# Patient Record
Sex: Female | Born: 1985 | Race: Black or African American | Hispanic: No | Marital: Married | Smoking: Never smoker
Health system: Southern US, Community
[De-identification: ages and names within clinical notes are randomized; demographics above are authoritative.]

## PROBLEM LIST (undated history)

## (undated) DIAGNOSIS — D219 Benign neoplasm of connective and other soft tissue, unspecified: Secondary | ICD-10-CM

## (undated) DIAGNOSIS — R87629 Unspecified abnormal cytological findings in specimens from vagina: Secondary | ICD-10-CM

## (undated) HISTORY — DX: Unspecified abnormal cytological findings in specimens from vagina: R87.629

## (undated) HISTORY — DX: Benign neoplasm of connective and other soft tissue, unspecified: D21.9

## (undated) HISTORY — PX: GYNECOLOGIC CRYOSURGERY: SHX857

---

## 2006-02-27 ENCOUNTER — Encounter: Payer: Self-pay | Admitting: Family Medicine

## 2008-09-02 ENCOUNTER — Encounter: Payer: Self-pay | Admitting: Family Medicine

## 2010-05-28 ENCOUNTER — Ambulatory Visit: Payer: Self-pay | Admitting: Family Medicine

## 2010-05-28 DIAGNOSIS — M6740 Ganglion, unspecified site: Secondary | ICD-10-CM | POA: Insufficient documentation

## 2010-06-17 ENCOUNTER — Telehealth (INDEPENDENT_AMBULATORY_CARE_PROVIDER_SITE_OTHER): Payer: Self-pay | Admitting: *Deleted

## 2010-06-18 ENCOUNTER — Ambulatory Visit: Payer: Self-pay | Admitting: Family Medicine

## 2010-06-21 ENCOUNTER — Encounter: Payer: Self-pay | Admitting: Family Medicine

## 2010-06-21 LAB — CONVERTED CEMR LAB
BUN: 13 mg/dL (ref 6–23)
CO2: 27 meq/L (ref 19–32)
Calcium: 9.3 mg/dL (ref 8.4–10.5)
Chloride: 104 meq/L (ref 96–112)
Cholesterol: 146 mg/dL (ref 0–200)
Creatinine, Ser: 0.8 mg/dL (ref 0.4–1.2)
GFR calc non Af Amer: 111.96 mL/min (ref >60)
Glucose, Bld: 85 mg/dL (ref 70–99)
HDL: 60.7 mg/dL (ref >39.00)
LDL Cholesterol: 79 mg/dL (ref 0–99)
Potassium: 4.2 meq/L (ref 3.5–5.1)
Sodium: 137 meq/L (ref 135–145)
Total CHOL/HDL Ratio: 2
Triglycerides: 31 mg/dL (ref 0.0–149.0)
VLDL: 6.2 mg/dL (ref 0.0–40.0)

## 2010-06-24 ENCOUNTER — Ambulatory Visit: Payer: Self-pay | Admitting: Family Medicine

## 2010-06-24 ENCOUNTER — Other Ambulatory Visit: Admission: RE | Admit: 2010-06-24 | Discharge: 2010-06-24 | Payer: Self-pay | Admitting: Family Medicine

## 2010-06-30 ENCOUNTER — Encounter: Payer: Self-pay | Admitting: Family Medicine

## 2010-07-12 LAB — CONVERTED CEMR LAB: Pap Smear: NEGATIVE

## 2010-09-30 ENCOUNTER — Ambulatory Visit
Admission: RE | Admit: 2010-09-30 | Discharge: 2010-09-30 | Payer: Self-pay | Source: Home / Self Care | Attending: Family Medicine | Admitting: Family Medicine

## 2010-09-30 DIAGNOSIS — H0019 Chalazion unspecified eye, unspecified eyelid: Secondary | ICD-10-CM | POA: Insufficient documentation

## 2010-10-12 NOTE — Progress Notes (Signed)
----   Converted from flag ---- ---- 06/17/2010 11:25 AM, Ruthe Mannan MD wrote: lipid panel (V81.0), BMET (v70.0)  ---- 06/17/2010 11:03 AM, Liane Comber CMA (AAMA) wrote: Lab orders please! Good Morning! This pt is scheduled for cpx labs tomorrow, which labs to draw and dx codes to use? Thanks Tasha ------------------------------

## 2010-10-12 NOTE — Assessment & Plan Note (Signed)
Summary: CPX AND PAP/DLO   Vital Signs:  Patient profile:   25 year old female Height:      60 inches Weight:      122 pounds BMI:     23.91 Temp:     98.3 degrees F oral Pulse rate:   68 / minute Pulse rhythm:   regular BP sitting:   92 / 64  (left arm) Cuff size:   regular  Vitals Entered By: Linde Gillis CMA Duncan Dull) (June 24, 2010 8:37 AM) CC: complete physicial with pap   History of Present Illness: 25 yo here for CPX with pap.   Stye on right eye lid- improved, used warm compresses.  Feels better.    Well woman- not currently sexually active.  Has only had one other pap smear in her life, it was normal.  Denies any vaginal discharge, abdominal pain or dysuria.    Current Medications (verified): 1)  None  Allergies (verified): No Known Drug Allergies  Past History:  Family History: Last updated: 05/28/2010 Unremarkable  Social History: Last updated: 05/28/2010 Single Never Smoked Alcohol use-yes Regular exercise-yes Teacher  Risk Factors: Exercise: yes (05/28/2010)  Risk Factors: Smoking Status: never (05/28/2010)  Review of Systems      See HPI General:  Denies malaise. Eyes:  Denies blurring. ENT:  Denies difficulty swallowing. CV:  Denies chest pain or discomfort. Resp:  Denies shortness of breath. GI:  Denies abdominal pain and change in bowel habits. GU:  Denies abnormal vaginal bleeding, dysuria, urinary frequency, and urinary hesitancy. MS:  Denies joint pain, joint redness, and joint swelling. Derm:  Denies rash. Neuro:  Denies headaches. Psych:  Denies anxiety and depression. Endo:  Denies cold intolerance and heat intolerance. Heme:  Denies abnormal bruising and bleeding.  Physical Exam  General:  Well-developed,well-nourished,in no acute distress; alert,appropriate and cooperative throughout examination Head:  normocephalic and atraumatic.   Eyes:  No corneal or conjunctival inflammation noted. EOMI. Perrla. Funduscopic exam  benign, without hemorrhages, exudates or papilledema. Vision grossly normal. Ears:  R ear normal and L ear normal.   Nose:  no external deformity.   Mouth:  good dentition.   Neck:  No deformities, masses, or tenderness noted. Breasts:  No mass, nodules, thickening, tenderness, bulging, retraction, inflamation, nipple discharge or skin changes noted.   Lungs:  Normal respiratory effort, chest expands symmetrically. Lungs are clear to auscultation, no crackles or wheezes. Heart:  Normal rate and regular rhythm. S1 and S2 normal without gallop, murmur, click, rub or other extra sounds. Abdomen:  Bowel sounds positive,abdomen soft and non-tender without masses, organomegaly or hernias noted. Rectal:  no external abnormalities and no hemorrhoids.   Genitalia:  Pelvic Exam:        External: normal female genitalia without lesions or masses        Vagina: normal without lesions or masses        Cervix: normal without lesions or masses        Adnexa: normal bimanual exam without masses or fullness        Uterus: normal by palpation        Pap smear: performed Pulses:  R and L carotid,radial,femoral,dorsalis pedis and posterior tibial pulses are full and equal bilaterally Extremities:  no edema Neurologic:  alert & oriented X3 and gait normal.   Skin:  Intact without suspicious lesions or rashes Psych:  Cognition and judgment appear intact. Alert and cooperative with normal attention span and concentration. No apparent delusions, illusions, hallucinations  Impression & Recommendations:  Problem # 1:  Preventive Health Care (ICD-V70.0) Reviewed preventive care protocols, scheduled due services, and updated immunizations Discussed nutrition, exercise, diet, and healthy lifestyle.  Discussed labs with her- cholesterol and BMET look great! Pap today.  Not sexually active, was not intersted in Chlamydia screening. Declined flu shot.  Current Allergies (reviewed today): No known  allergies   TD Result Date:  06/25/2008 TD Result:  historical   Prevention & Chronic Care Immunizations   Influenza vaccine: Not documented   Influenza vaccine deferral: Refused  (06/24/2010)    Tetanus booster: 06/25/2008: historical   Tetanus booster due: 06/25/2018    Pneumococcal vaccine: Not documented  Other Screening   Pap smear: Not documented   Pap smear action/deferral: Ordered  (06/24/2010)   Smoking status: never  (05/28/2010)   Nursing Instructions: Pap smear today

## 2010-10-12 NOTE — Assessment & Plan Note (Signed)
Summary: NEW PT TO BE ESTABLISHED/JRR   Vital Signs:  Patient profile:   25 year old female Height:      60 inches Weight:      124 pounds BMI:     24.30 Temp:     98.7 degrees F oral Pulse rate:   68 / minute Pulse rhythm:   regular BP sitting:   90 / 52  (left arm) Cuff size:   regular  Vitals Entered By: Linde Gillis CMA Duncan Dull) (May 28, 2010 1:52 PM) CC: new patient, establish care   History of Present Illness: 25 yo here to establish care.  Stye on right eye lid- started last week, recurrent.  Uses warm compresses, goes away and returns on the other eye.  No blurred vision, photophobia or eye pain.  No redness of eye.  Cyst on right wrist- noticed it last week.  Had one in same place last year but went away on it's own.  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  No tingling in fingertips, weakness, or erythema.    Preventive Screening-Counseling & Management  Alcohol-Tobacco     Smoking Status: never  Caffeine-Diet-Exercise     Does Patient Exercise: yes  Current Medications (verified): 1)  Erythromycin 5 Mg/gm Oint (Erythromycin) .... 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 Eyes:  Denies blurring, discharge, eye irritation, eye pain, light sensitivity, and red eye. ENT:  Denies decreased hearing and difficulty swallowing. CV:  Denies chest pain or discomfort. Resp:  Denies shortness of breath. GI:  Denies abdominal pain and change in bowel habits. GU:  Denies abnormal vaginal bleeding, discharge, and dysuria. MS:  Denies muscle weakness. Derm:  Denies rash. Neuro:  Denies headaches. Psych:  Denies anxiety and depression. Endo:  Denies cold intolerance and heat  intolerance. Heme:  Denies abnormal bruising and bleeding.  Physical Exam  General:  Well-developed,well-nourished,in no acute distress; alert,appropriate and cooperative throughout examination Eyes:  vision grossly intact, pupils equal, pupils round, and pupils reactive to light.   Right chalazion Ears:  R ear normal and L ear normal.   Nose:  no external deformity.   Mouth:  good dentition.   Lungs:  Normal respiratory effort, chest expands symmetrically. Lungs are clear to auscultation, no crackles or wheezes. Heart:  Normal rate and regular rhythm. S1 and S2 normal without gallop, murmur, click, rub or other extra sounds. Abdomen:  Bowel sounds positive,abdomen soft and non-tender without masses, organomegaly or hernias noted. Msk:  Right dorsal ganglion cyst on wrist, nontender to palp, no surrounding erythema.  Normal strength and sensation. Neurologic:  No cranial nerve deficits noted. Station and gait are normal. Plantar reflexes are down-going bilaterally. DTRs are symmetrical throughout. Sensory, motor and coordinative functions appear intact. Skin:  Intact without suspicious lesions or rashes Psych:  Cognition and judgment appear intact. Alert and cooperative with normal attention span and concentration. No apparent delusions, illusions, hallucinations   Impression & Recommendations:  Problem # 1:  GANGLION CYST, WRIST, RIGHT (ICD-727.41) Assessment New Agreed on watchful waiting since it just appeared last week. If grows in size or becomes painful, will refer to ortho.  Problem # 2:  STYE (ICD-373.11) Assessment: New vs chalazion.  Advised  to continue warm compresses as chalazions are non infections, more granulatomous in nature.  Will try some erythromycin ointment, can stop if not helpful within a week.  Explained that chalazions often resolve on own but can take months.  If does not resolve, will need optho referral for removal.  Complete Medication List: 1)   Erythromycin 5 Mg/gm Oint (Erythromycin) .... Instill 1/2 inch two times a day x 1 week  Patient Instructions: 1)  Nice to meet you. 2)  Please make an appointment for a complete physical and PAP. Prescriptions: ERYTHROMYCIN 5 MG/GM OINT (ERYTHROMYCIN) instill 1/2 inch two times a day x 1 week  #1 x 0   Entered and Authorized by:   Ruthe Mannan MD   Signed by:   Ruthe Mannan MD on 05/28/2010   Method used:   Print then Give to Patient   RxID:   [id number]   Prior Medications (reviewed today): None Current Allergies (reviewed today): No known allergies   TD Result Date:  05/16/2006 TD Next Due:  10 yr

## 2010-10-12 NOTE — Miscellaneous (Signed)
Summary: Vaccine record/[facility]  Vaccine record/[facility]   Imported By: Sherian Rein 06/24/2010 09:45:16  _____________________________________________________________________  External Attachment:    Type:   Image     Comment:   External Document

## 2010-10-12 NOTE — Letter (Signed)
Summary: Generic Letter  [place] [facility]  [address] [place], [place] [address]   Phone: [phone]  Fax: [phone]    06/21/2010  JENAH VANASTEN [address] [place], [place]  [address]  Dear Ms. Aundria Rud,     We have received your lab results and Dr. Dayton Martes says that your labs look great!  Keep up the good work.  Enclosed you will find a copy of your lab results.      Sincerely,        Linde Gillis CMA (AAMA)for Dr. Ruthe Mannan

## 2010-10-12 NOTE — Letter (Signed)
Summary: Results Follow up Letter  [place] [facility]  [address] [place], [place] [address]   Phone: [phone]  Fax: [phone]    06/30/2010 MRN: [id number]  Anne Long [address] [place], [place]  [address]  Dear [name],  The following are the results of your recent test(s):  Test         Result    Pap Smear:        Normal __X___  Not Normal _____ Comments: ______________________________________________________ Cholesterol: LDL(Bad cholesterol):         Your goal is less than:         HDL (Good cholesterol):       Your goal is more than: Comments:  ______________________________________________________ Mammogram:        Normal _____  Not Normal _____ Comments:  ___________________________________________________________________ Hemoccult:        Normal _____  Not normal _______ Comments:    _____________________________________________________________________ Other Tests:    We routinely do not discuss normal results over the telephone.  If you desire a copy of the results, or you have any questions about this information we can discuss them at your next office visit.   Sincerely,      Linde Gillis, CMA (AAMA)for Dr. Ruthe Mannan

## 2010-10-12 NOTE — Letter (Signed)
Summary: Date Range: 1987/07/22 to 02-27-06/[facility]   Date Range: 29-Aug-1987 to 02-27-06/[facility]   Imported By: Sherian Rein 06/24/2010 09:42:53  _____________________________________________________________________  External Attachment:    Type:   Image     Comment:   External Document

## 2010-10-14 NOTE — Assessment & Plan Note (Signed)
Summary: ?STYE ON EYE/CLE   Vital Signs:  Patient profile:   25 year old female Height:      60 inches Weight:      121.75 pounds BMI:     23.86 Temp:     98.4 degrees F oral Pulse rate:   76 / minute Pulse rhythm:   regular BP sitting:   110 / 70  (right arm) Cuff size:   regular  Vitals Entered By: Linde Gillis CMA Duncan Dull) (September 30, 2010 3:49 PM) CC: stye on eye   History of Present Illness: 25 yo here for lesion on right eye lid that has not resolved since this summer.  Had improved with warm compresses and but has grown in size again. Not painful, no blurred vision, no conjunctival injection. No eye discharge, fevers, or chills.  Allergies (verified): No Known Drug Allergies  Review of Systems      See HPI General:  Denies fever. Eyes:  Denies blurring, discharge, eye pain, light sensitivity, and red eye.  Physical Exam  General:  Well-developed,well-nourished,in no acute distress; alert,appropriate and cooperative throughout examination Eyes:  No corneal or conjunctival inflammation noted. EOMI. Perrla. Funduscopic exam benign, without hemorrhages, exudates or papilledema. Vision grossly normal. chalazion on right eye lid. Psych:  Cognition and judgment appear intact. Alert and cooperative with normal attention span and concentration. No apparent delusions, illusions, hallucinations   Impression & Recommendations:  Problem # 1:  CHALAZION, RIGHT (ICD-373.2) Assessment New Will refer to optho for removal. Orders: Ophthalmology Referral (Ophthalmology)  Patient Instructions: 1)  Please stop by to see Shirlee Limerick on your way out.   Orders Added: 1)  Ophthalmology Referral [Ophthalmology] 2)  Est. Patient Level III [[address]]    Prior Medications (reviewed today): None Current Allergies (reviewed today): No known allergies

## 2010-12-02 ENCOUNTER — Encounter: Payer: Self-pay | Admitting: Family Medicine

## 2010-12-02 LAB — HM PAP SMEAR

## 2010-12-07 ENCOUNTER — Ambulatory Visit (INDEPENDENT_AMBULATORY_CARE_PROVIDER_SITE_OTHER): Payer: BC Managed Care – PPO | Admitting: Family Medicine

## 2010-12-07 ENCOUNTER — Encounter: Payer: Self-pay | Admitting: Family Medicine

## 2010-12-07 VITALS — BP 110/68 | HR 75 | Temp 98.0°F | Ht 59.0 in | Wt 121.0 lb

## 2010-12-07 DIAGNOSIS — N6310 Unspecified lump in the right breast, unspecified quadrant: Secondary | ICD-10-CM

## 2010-12-07 DIAGNOSIS — N63 Unspecified lump in unspecified breast: Secondary | ICD-10-CM

## 2010-12-07 NOTE — Assessment & Plan Note (Signed)
Likely benign. Will refer for ultrasound to evaluated further. The patient indicates understanding of these issues and agrees with the plan.

## 2010-12-07 NOTE — Patient Instructions (Signed)
Great to see you. Please stop by to see Anne Long on your way out.   

## 2010-12-07 NOTE — Progress Notes (Signed)
  Subjective:    Patient ID: Anne Long, female    DOB: Sep 29, 1985, 25 y.o.   MRN: [id number]  HPI 25 yo here for right breast mass. Noticed it over a week ago. Scheduled to start her period next week.  Was a little tender to palpation at first, not painful now. No erythema or warmth. No fevers or chills.  No family history of breast cancer.   Review of Systems  Constitutional: Negative for fever, activity change and fatigue.  Skin: Negative for color change.       Objective:   BP 110/68  Pulse 75  Temp(Src) 98 F (36.7 C) (Oral)  Ht 4\' 11"  (1.499 m)  Wt 121 lb (54.885 kg)  BMI 24.44 kg/m2    Physical Exam  Constitutional: She appears well-developed.  BP 110/68  Pulse 75  Temp(Src) 98 F (36.7 C) (Oral)  Ht 4\' 11"  (1.499 m)  Wt 121 lb (54.885 kg)  BMI 24.44 kg/m2  General:  Well-developed,well-nourished,in no acute distress; alert,appropriate and cooperative throughout examination Breasts: right breast:  2 cm palpable mass at 2 oclock, non tender to palpation, no thickening, bulging, retraction, inflamation, nipple discharge or skin changes noted.   Lungs:  Normal respiratory effort, chest expands symmetrically. Lungs are clear to auscultation, no crackles or wheezes. Heart:  Normal rate and regular rhythm. S1 and S2 normal without gallop, murmur, click, rub or other extra sounds. Axillary Nodes:  No palpable lymphadenopathy Psych:  Cognition and judgment appear intact. Alert and cooperative with normal attention span and concentration. No apparent delusions, illusions, hallucinations         Assessment & Plan:

## 2010-12-08 ENCOUNTER — Ambulatory Visit: Payer: Self-pay | Admitting: Family Medicine

## 2010-12-15 ENCOUNTER — Encounter: Payer: Self-pay | Admitting: Family Medicine

## 2011-05-25 ENCOUNTER — Ambulatory Visit: Payer: BC Managed Care – PPO | Admitting: Family Medicine

## 2011-07-21 ENCOUNTER — Encounter: Payer: BC Managed Care – PPO | Admitting: Family Medicine

## 2015-04-29 ENCOUNTER — Encounter: Payer: Self-pay | Admitting: Primary Care

## 2015-04-29 ENCOUNTER — Other Ambulatory Visit (HOSPITAL_COMMUNITY)
Admission: RE | Admit: 2015-04-29 | Discharge: 2015-04-29 | Disposition: A | Discharge status: Home / Self Care | Payer: BC Managed Care – PPO | Source: Ambulatory Visit | Attending: Primary Care | Admitting: Primary Care

## 2015-04-29 ENCOUNTER — Ambulatory Visit (INDEPENDENT_AMBULATORY_CARE_PROVIDER_SITE_OTHER): Payer: BC Managed Care – PPO | Admitting: Primary Care

## 2015-04-29 VITALS — BP 106/68 | HR 69 | Temp 97.5°F | Ht 61.0 in | Wt 135.8 lb

## 2015-04-29 DIAGNOSIS — Z0000 Encounter for general adult medical examination without abnormal findings: Secondary | ICD-10-CM | POA: Insufficient documentation

## 2015-04-29 DIAGNOSIS — Z1151 Encounter for screening for human papillomavirus (HPV): Secondary | ICD-10-CM | POA: Insufficient documentation

## 2015-04-29 DIAGNOSIS — Z01419 Encounter for gynecological examination (general) (routine) without abnormal findings: Secondary | ICD-10-CM | POA: Insufficient documentation

## 2015-04-29 LAB — LIPID PANEL
Cholesterol: 166 mg/dL (ref 0–200)
HDL: 65.7 mg/dL (ref >39.00)
LDL Cholesterol: 89 mg/dL (ref 0–99)
NonHDL: 100.55
Total CHOL/HDL Ratio: 3
Triglycerides: 57 mg/dL (ref 0.0–149.0)
VLDL: 11.4 mg/dL (ref 0.0–40.0)

## 2015-04-29 LAB — COMPREHENSIVE METABOLIC PANEL
ALT: 12 U/L (ref 0–35)
AST: 17 U/L (ref 0–37)
Albumin: 4 g/dL (ref 3.5–5.2)
Alkaline Phosphatase: 39 U/L (ref 39–117)
BUN: 11 mg/dL (ref 6–23)
CO2: 27 mEq/L (ref 19–32)
Calcium: 9.2 mg/dL (ref 8.4–10.5)
Chloride: 103 mEq/L (ref 96–112)
Creatinine, Ser: 0.74 mg/dL (ref 0.40–1.20)
GFR: 119.66 mL/min (ref >60.00)
Glucose, Bld: 92 mg/dL (ref 70–99)
Potassium: 3.6 mEq/L (ref 3.5–5.1)
Sodium: 136 mEq/L (ref 135–145)
Total Bilirubin: 0.4 mg/dL (ref 0.2–1.2)
Total Protein: 7.1 g/dL (ref 6.0–8.3)

## 2015-04-29 LAB — TSH: TSH: 1.28 u[IU]/mL (ref 0.35–4.50)

## 2015-04-29 LAB — CBC
HCT: 36.2 % (ref 36.0–46.0)
Hemoglobin: 12 g/dL (ref 12.0–15.0)
MCHC: 33.2 g/dL (ref 30.0–36.0)
MCV: 83.6 fl (ref 78.0–100.0)
Platelets: 247 10*3/uL (ref 150.0–400.0)
RBC: 4.33 Mil/uL (ref 3.87–5.11)
RDW: 13.6 % (ref 11.5–15.5)
WBC: 6.8 10*3/uL (ref 4.0–10.5)

## 2015-04-29 NOTE — Progress Notes (Signed)
Subjective:    Patient ID: Anne Long, female    DOB: 01-30-1986, 29 y.o.   MRN: [id number]  HPI  Anne Long is a 29 year old female who presents today to establish care and for complete physical.  Immunizations: -Tetanus: Completed in 2009 -Influenza: Did not receive last season   Diet: Endorses fair diet. Breakfast: Skips Lunch: [employer] at her school. Dinner: Fast food, baked lean meats, vegetables. 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 Eye exam: 2 years ago. Dental exam: Completed 1 year ago. Pap Smear: Due.  1) Sore throat: Present for 2 days. She also reports symptoms of mild cough. Denies fevers, ear pain, nasal congestion. She's taken Nyquil at night with some relief.   Review of Systems  Constitutional: Negative for unexpected weight change.  HENT: Positive for ear pain and sore throat. Negative for congestion and sinus pressure.   Respiratory: Positive for cough. Negative for shortness of breath.   Cardiovascular: Negative for chest pain.  Gastrointestinal: Negative for diarrhea and constipation.  Genitourinary: Negative for difficulty urinating.  Musculoskeletal: Negative for myalgias and arthralgias.  Skin: Negative for rash.  Allergic/Immunologic: Negative for environmental allergies.  Neurological: Negative for dizziness, numbness and headaches.  Psychiatric/Behavioral:       Denies concerns for anxiety or depression       No past medical history on file.  Social History   Social History  . Marital Status: Single    Spouse Name: N/A  . Number of Children: N/A  . Years of Education: N/A   Occupational History  . Teacher    Social History Main Topics  . Smoking status: Never Smoker   . Smokeless tobacco: Not on file  . Alcohol Use: Yes  . Drug Use: No  . Sexual Activity: Not on file   Other Topics Concern  . Not on file   Social History Narrative    No past surgical history on file.  No family history  on file.  No Known Allergies  No current outpatient prescriptions on file prior to visit.   No current facility-administered medications on file prior to visit.    BP 106/68 mmHg  Pulse 69  Temp(Src) 97.5 F (36.4 C) (Oral)  Ht 5\' 1"  (1.549 m)  Wt 135 lb 12.8 oz (61.598 kg)  BMI 25.67 kg/m2  SpO2 96%  LMP 04/05/2015    Objective:   Physical Exam  Constitutional: She is oriented to person, place, and time. She appears well-nourished.  HENT:  Right Ear: Tympanic membrane and ear canal normal.  Left Ear: Tympanic membrane and ear canal normal.  Nose: Nose normal.  Mouth/Throat: Oropharynx is clear and moist.  Eyes: Conjunctivae and EOM are normal. Pupils are equal, round, and reactive to light.  Neck: Neck supple. No thyromegaly present.  Cardiovascular: Normal rate and regular rhythm.   Pulmonary/Chest: Effort normal and breath sounds normal.  Abdominal: Soft. Bowel sounds are normal. There is no tenderness.  Genitourinary: Vagina normal. Right adnexum displays no tenderness. Left adnexum displays no tenderness. No vaginal discharge found.  Musculoskeletal: Normal range of motion.  Lymphadenopathy:    She has no cervical adenopathy.  Neurological: She is alert and oriented to person, place, and time. She has normal reflexes. No cranial nerve deficit.  Skin: Skin is warm and dry. No rash noted.  Psychiatric: She has a normal mood and affect.          Assessment & Plan:  Sore throat:  Present  x 2 days. Exam without edema, exudate. Slightly red. Vitals stable. Treat with supportive measures: salt gargles, ibuprofen, chloraseptic spray. Follow up PRN

## 2015-04-29 NOTE — Progress Notes (Signed)
Pre visit review using our clinic review tool, if applicable. No additional management support is needed unless otherwise documented below in the visit note. 

## 2015-04-29 NOTE — Assessment & Plan Note (Signed)
Tetanus up to date Pap due and completed today. Exam unremarkable. Labs pending. Discussed the importance of healthy diet and how to make improvements in her meals. Follow up in 1 year or sooner if needed.

## 2015-04-29 NOTE — Patient Instructions (Addendum)
You may take ibuprofen 600 mg three times daily for sore throat. You may also gargle warm salt water, use chloraseptic spray or throat lozenges.   Complete lab work prior to leaving today. I will notify you of your results.  Call me if your symptoms do not improve on Monday next week.  Follow up in 1 year for repeat physical.  It was a pleasure to meet you today! Please don't hesitate to call me with any questions. Welcome to [facility]!

## 2015-04-30 ENCOUNTER — Encounter: Payer: Self-pay | Admitting: *Deleted

## 2015-05-04 ENCOUNTER — Telehealth: Payer: Self-pay

## 2015-05-04 NOTE — Telephone Encounter (Signed)
Do we need to re-collect? If so she does not need to be charged for a visit as this was our mistake. Please have her come in at her convenience, I don't believe we will need to do vitals, notes, weight, etc.

## 2015-05-04 NOTE — Telephone Encounter (Signed)
Anne Long with Anne Long received pap smear on 05/01/15; no name on vial but paperwork had Anne Long and pap smear done on 04/29/15. Anne Long sending pap smear back to Anne Long since no name on vial.

## 2015-05-05 NOTE — Telephone Encounter (Signed)
Re-check schedule. Patient was the only patient that we had with a pap. Made correction and send pap.

## 2015-05-07 ENCOUNTER — Encounter: Payer: Self-pay | Admitting: *Deleted

## 2015-05-07 LAB — CYTOLOGY - PAP

## 2016-03-07 ENCOUNTER — Telehealth: Payer: Self-pay | Admitting: Family Medicine

## 2016-03-07 NOTE — Telephone Encounter (Signed)
Pt has appt with Dr Deborra Medina 03/08/16 at 12:45

## 2016-03-07 NOTE — Telephone Encounter (Signed)
Patient Name: Anne Long  DOB: 1985-12-07    Initial Comment Caller states, neck pains which normally start in the morning which gets worse during the day, the last 2 days it has been worse. Today it is radiating down her Rt arm, she can no longer turn her neck side to side. -- she doesn't think she has a fever, and she can touch chin to chest, but it is very painful.    Nurse Assessment  Nurse: Raphael Gibney, RN, Vanita Ingles Date/Time (Eastern Time): 03/07/2016 10:34:33 AM  Confirm and document reason for call. If symptomatic, describe symptoms. You must click the next button to save text entered. ---Caller states she is having neck pain which is radiating down her right arm. No injury. Pain is constant and pain level 10 with exertion and 4 at rest. No fever.  Has the patient traveled out of the country within the last 30 days? ---Not Applicable  Does the patient have any new or worsening symptoms? ---Yes  Will a triage be completed? ---Yes  Related visit to physician within the last 2 weeks? ---No  Does the PT have any chronic conditions? (i.e. diabetes, asthma, etc.) ---No  Is the patient pregnant or possibly pregnant? (Ask all females between the ages of 4-55) ---No  Is this a behavioral health or substance abuse call? ---No     Guidelines    Guideline Title Affirmed Question Affirmed Notes  Neck Pain or Stiffness [1] MODERATE neck pain (e.g., interferes with normal activities AND [2] present > 3 days    Final Disposition User   See PCP When Office is Open (within 3 days) Raphael Gibney, RN, Vanita Ingles    Comments  Appt scheduled for 12:45 pm on 03/08/2016 with Dr. Arnette Norris   Referrals  REFERRED TO PCP OFFICE   Disagree/Comply: Comply

## 2016-03-08 ENCOUNTER — Encounter: Payer: Self-pay | Admitting: Family Medicine

## 2016-03-08 ENCOUNTER — Ambulatory Visit (INDEPENDENT_AMBULATORY_CARE_PROVIDER_SITE_OTHER): Payer: BC Managed Care – PPO | Admitting: Family Medicine

## 2016-03-08 ENCOUNTER — Ambulatory Visit (INDEPENDENT_AMBULATORY_CARE_PROVIDER_SITE_OTHER)
Admission: RE | Admit: 2016-03-08 | Discharge: 2016-03-08 | Disposition: A | Discharge status: Home / Self Care | Payer: BC Managed Care – PPO | Source: Ambulatory Visit | Attending: Family Medicine | Admitting: Family Medicine

## 2016-03-08 VITALS — BP 108/64 | HR 88 | Temp 98.4°F | Wt 139.8 lb

## 2016-03-08 DIAGNOSIS — M542 Cervicalgia: Secondary | ICD-10-CM

## 2016-03-08 MED ORDER — DEXAMETHASONE SODIUM PHOSPHATE 10 MG/ML IJ SOLN
10.0000 mg | Freq: Once | INTRAMUSCULAR | Status: AC
  Administered 2016-03-08: 10 mg via INTRAMUSCULAR

## 2016-03-08 MED ORDER — CYCLOBENZAPRINE HCL 5 MG PO TABS: 5.0000 mg | ORAL_TABLET | Freq: Every evening | ORAL | Status: DC | PRN

## 2016-03-08 NOTE — Assessment & Plan Note (Signed)
New now with radiculopathy. Given duration and progression of symptoms, cervical xray today.  IM decadron given in office to acutely decrease inflammation. Advised no NSAID for 3 days but ok to take tylenol (see AVS). eRx sent for flexeril to use at bedtime.  Discussed sedation precautions. Call or return to clinic prn if these symptoms worsen or fail to improve as anticipated. The patient indicates understanding of these issues and agrees with the plan.

## 2016-03-08 NOTE — Patient Instructions (Signed)
Good to see you. Please do not take any alleve or Ibuprofen for next 3 days. Ok to take tylenol twice daily and flexeril as needed at bedtime.  I will call you with your xray results.

## 2016-03-08 NOTE — Progress Notes (Signed)
   Subjective:   Patient ID: Anne Long, female    DOB: [birth year], 30 y.o.   MRN: [id number]  Anne Long is a pleasant 30 y.o. year old female pt whom I have not seen since 2012, who presents to clinic today with Neck Pain  on 03/08/2016  HPI:  Neck pain- for past 6 months, wakes up in the morning with neck pain.  Can be a 10/10.  Seems to get better as the day goes on.  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  This weekend, she did experience some right UE numbness.  Alleve does help with the pain.      No current outpatient prescriptions on file prior to visit.   No current facility-administered medications on file prior to visit.    No Known Allergies  No past medical history on file.  No past surgical history on file.  No family history on file.  Social History   Social History  . Marital Status: Single    Spouse Name: N/A  . Number of Children: N/A  . Years of Education: N/A   Occupational History  . Teacher    Social History Main Topics  . Smoking status: Never Smoker   . Smokeless tobacco: Not on file  . Alcohol Use: Yes  . Drug Use: No  . Sexual Activity: Not on file   Other Topics Concern  . Not on file   Social History Narrative   The PMH, PSH, Social History, Family History, Medications, and allergies have been reviewed in [facility], and have been updated if relevant.   Review of Systems  Constitutional: Negative.   Musculoskeletal: Positive for neck pain and neck stiffness. Negative for joint swelling and gait problem.  Neurological: Positive for numbness. Negative for tremors, seizures, syncope, speech difficulty and weakness.  All other systems reviewed and are negative.      Objective:    BP 108/64 mmHg  Pulse 88  Temp(Src) 98.4 F (36.9 C) (Oral)  Wt 139 lb 12 oz (63.39 kg)  SpO2 99%  LMP 02/11/2016   Physical Exam  Constitutional: She appears well-developed and well-nourished. No distress.  HENT:    Head: Normocephalic and atraumatic.  Eyes: Conjunctivae are normal.  Cardiovascular: Normal rate.   Pulmonary/Chest: Effort normal.  Musculoskeletal:       Cervical back: She exhibits decreased range of motion, tenderness and spasm. She exhibits no swelling, no edema, no deformity, no laceration, no pain and normal pulse.  Neurological: She is alert. She has normal strength and normal reflexes. No cranial nerve deficit or sensory deficit.  Skin: Skin is warm and dry.  Psychiatric: She has a normal mood and affect. Her behavior is normal. Judgment and thought content normal.  Nursing note and vitals reviewed.         Assessment & Plan:   No diagnosis found. No Follow-up on file.

## 2016-03-08 NOTE — Progress Notes (Signed)
Pre visit review using our clinic review tool, if applicable. No additional management support is needed unless otherwise documented below in the visit note. 

## 2016-07-18 ENCOUNTER — Encounter: Payer: Self-pay | Admitting: Emergency Medicine

## 2016-07-18 ENCOUNTER — Emergency Department
Admission: EM | Admit: 2016-07-18 | Discharge: 2016-07-18 | Disposition: A | Discharge status: Home / Self Care | Payer: BC Managed Care – PPO | Attending: Emergency Medicine | Admitting: Emergency Medicine

## 2016-07-18 ENCOUNTER — Telehealth: Payer: Self-pay | Admitting: Family Medicine

## 2016-07-18 DIAGNOSIS — T39391A Poisoning by other nonsteroidal anti-inflammatory drugs [NSAID], accidental (unintentional), initial encounter: Secondary | ICD-10-CM | POA: Insufficient documentation

## 2016-07-18 DIAGNOSIS — Z79899 Other long term (current) drug therapy: Secondary | ICD-10-CM | POA: Diagnosis not present

## 2016-07-18 DIAGNOSIS — K2900 Acute gastritis without bleeding: Secondary | ICD-10-CM | POA: Insufficient documentation

## 2016-07-18 DIAGNOSIS — R1010 Upper abdominal pain, unspecified: Secondary | ICD-10-CM | POA: Diagnosis present

## 2016-07-18 LAB — CBC WITH DIFFERENTIAL/PLATELET
Basophils Absolute: 0 10*3/uL (ref 0–0.1)
Basophils Relative: 1 %
Eosinophils Absolute: 0 10*3/uL (ref 0–0.7)
Eosinophils Relative: 1 %
HCT: 38.3 % (ref 35.0–47.0)
Hemoglobin: 12.7 g/dL (ref 12.0–16.0)
Lymphocytes Relative: 30 %
Lymphs Abs: 1.8 10*3/uL (ref 1.0–3.6)
MCH: 27.7 pg (ref 26.0–34.0)
MCHC: 33.2 g/dL (ref 32.0–36.0)
MCV: 83.4 fL (ref 80.0–100.0)
Monocytes Absolute: 0.6 10*3/uL (ref 0.2–0.9)
Monocytes Relative: 9 %
Neutro Abs: 3.6 10*3/uL (ref 1.4–6.5)
Neutrophils Relative %: 60 %
Platelets: 267 10*3/uL (ref 150–440)
RBC: 4.6 MIL/uL (ref 3.80–5.20)
RDW: 13.2 % (ref 11.5–14.5)
WBC: 6.1 10*3/uL (ref 3.6–11.0)

## 2016-07-18 LAB — COMPREHENSIVE METABOLIC PANEL
ALT: 13 U/L — ABNORMAL LOW (ref 14–54)
AST: 20 U/L (ref 15–41)
Albumin: 4.4 g/dL (ref 3.5–5.0)
Alkaline Phosphatase: 50 U/L (ref 38–126)
Anion gap: 8 (ref 5–15)
BUN: 12 mg/dL (ref 6–20)
CO2: 26 mmol/L (ref 22–32)
Calcium: 9.6 mg/dL (ref 8.9–10.3)
Chloride: 103 mmol/L (ref 101–111)
Creatinine, Ser: 0.64 mg/dL (ref 0.44–1.00)
GFR calc Af Amer: >60 mL/min (ref >60)
GFR calc non Af Amer: >60 mL/min (ref >60)
Glucose, Bld: 95 mg/dL (ref 65–99)
Potassium: 3.7 mmol/L (ref 3.5–5.1)
Sodium: 137 mmol/L (ref 135–145)
Total Bilirubin: 0.8 mg/dL (ref 0.3–1.2)
Total Protein: 7.9 g/dL (ref 6.5–8.1)

## 2016-07-18 MED ORDER — OMEPRAZOLE MAGNESIUM 20 MG PO TBEC: 20.0000 mg | DELAYED_RELEASE_TABLET | Freq: Every day | ORAL | 1 refills | Status: DC

## 2016-07-18 MED ORDER — SUCRALFATE 1 G PO TABS: 1.0000 g | ORAL_TABLET | Freq: Four times a day (QID) | ORAL | 1 refills | Status: DC | PRN

## 2016-07-18 NOTE — ED Triage Notes (Signed)
Pt with back pain that started 7 days ago. Pt has been taking Aleve 4 220mg  tabs every 6-12-hrs. Pt called her MD for stomach pain, related to MD how much aleve she had been taking, and MD sent her here. Denies pain at this time.

## 2016-07-18 NOTE — Telephone Encounter (Signed)
I spoke with pt and she is at [facility] ED now.

## 2016-07-18 NOTE — ED Provider Notes (Signed)
[facility] Emergency Department Provider Note  ____________________________________________   First MD Initiated Contact with Patient 07/18/16 1354     (approximate)  I have reviewed the triage vital signs and the nursing notes.   HISTORY  Chief Complaint Abdominal Pain    HPI Anne Long is a 30 y.o. female who is generally healthy who presents for evaluation of upper abdominal pain over the last 3 days accompanied with some vomiting yesterday.  It has been gradual in onset and became moderate in intensity but is actually better today after throwing up.  She has been seen by her primary care doctor and has been treated for neck pain and symptoms that are consistent with cervical radiculopathy.  She has been taking NSAIDs but no steroids.  Over she has been taking more than the recommended amount of Aleve, usually 3-4 tablets (220 mg each) anywhere from 2-4 times a day.  She called her primary doctor today to let them know about the stomach pain and how much Aleve she had been taking and they recommended she come here.  However she says her symptoms got much better after vomiting and she currently feels only a slight bit of discomfort.  She describes the pain as being below her rib cage in the center and sometimes radiating up towards her throat.  She has not noticed any blood in her stool and has had no blood in her vomit.  She denies fever/chills, chest pain, shortness of breath, diarrhea, constipation.  She states that her neck is feeling better.She does not take any antacid medications including PPIs.   History reviewed. No pertinent past medical history.  Patient Active Problem List   Diagnosis Date Noted  . Cervical pain (neck) 03/08/2016  . Preventative health care 04/29/2015  . Breast mass, right 12/07/2010  . CHALAZION, RIGHT 09/30/2010  . GANGLION CYST, WRIST, RIGHT 05/28/2010    History reviewed. No pertinent surgical history.  Prior to  Admission medications   Medication Sig Start Date End Date Taking? Authorizing Provider  cyclobenzaprine (FLEXERIL) 5 MG tablet Take 1 tablet (5 mg total) by mouth at bedtime as needed for muscle spasms. 03/08/16   Lucille Passy, MD  omeprazole (PRILOSEC OTC) 20 MG tablet Take 1 tablet (20 mg total) by mouth daily. 07/18/16 07/18/17  Hinda Kehr, MD  sucralfate (CARAFATE) 1 g tablet Take 1 tablet (1 g total) by mouth 4 (four) times daily as needed (for abdominal discomfort, nausea, and/or vomiting). 07/18/16   Hinda Kehr, MD    Allergies Patient has no known allergies.  History reviewed. No pertinent family history.  Social History Social History  Substance Use Topics  . Smoking status: Never Smoker  . Smokeless tobacco: Never Used  . Alcohol use No    Review of Systems Constitutional: No fever/chills Eyes: No visual changes. ENT: No sore throat. Cardiovascular: Denies chest pain. Respiratory: Denies shortness of breath. Gastrointestinal: Epigastric abdominal pain.  Vomiting today, nausea now resolved.  No diarrhea.  No constipation.  No blood in stool and no dark or tarry stools Genitourinary: Negative for dysuria. Musculoskeletal: Negative for back pain.  Neck pain over the last week or so that has improved. Skin: Negative for rash. Neurological: Negative for headaches, focal weakness or numbness.  10-point ROS otherwise negative.  ____________________________________________   PHYSICAL EXAM:  VITAL SIGNS: ED Triage Vitals [07/18/16 1211]  Enc Vitals Group     BP 110/66     Pulse Rate 74  Resp      Temp 98.2 F (36.8 C)     Temp Source Oral     SpO2 100 %     Weight 130 lb (59 kg)     Height 5' (1.524 m)     Head Circumference      Peak Flow      Pain Score      Pain Loc      Pain Edu?      Excl. in [place]?     Constitutional: Alert and oriented. Well appearing and in no acute distress. Eyes: Conjunctivae are normal. PERRL. EOMI. Head: Atraumatic. Nose: No  congestion/rhinnorhea. Mouth/Throat: Mucous membranes are moist.  Oropharynx non-erythematous. Neck: No stridor.  No meningeal signs.   Cardiovascular: Normal rate, regular rhythm. Good peripheral circulation. Grossly normal heart sounds. Respiratory: Normal respiratory effort.  No retractions. Lungs CTAB. Gastrointestinal: Soft and nontender. No distention.  Musculoskeletal: No lower extremity tenderness nor edema. No gross deformities of extremities. Neurologic:  Normal speech and language. No gross focal neurologic deficits are appreciated.  Skin:  Skin is warm, dry and intact. No rash noted. Psychiatric: Mood and affect are normal. Speech and behavior are normal.  ____________________________________________   LABS (all labs ordered are listed, but only abnormal results are displayed)  Labs Reviewed  COMPREHENSIVE METABOLIC PANEL - Abnormal; Notable for the following:       Result Value   ALT 13 (*)    All other components within normal limits  CBC WITH DIFFERENTIAL/PLATELET   ____________________________________________  EKG  None - EKG not ordered by ED physician ____________________________________________  RADIOLOGY   No results found.  ____________________________________________   PROCEDURES  Procedure(s) performed:   Procedures   Critical Care performed: No ____________________________________________   INITIAL IMPRESSION / ASSESSMENT AND PLAN / ED COURSE  Pertinent labs & imaging results that were available during my care of the patient were reviewed by me and considered in my medical decision making (see chart for details).  The patient currently has no symptoms and no abdominal tenderness to palpation.  We discussed a rectal exam to look for occult blood but we both feel that that is not necessary.  I think it is very likely that she has some acute gastritis related to unintentional NSAID overdose.  Her labs are all reassuring including her kidney  function.  We discussed avoiding NSAIDs for the time being to let her stomach recover, starting the use of an over-the-counter PPI such as Nexium or Prilosec, and I am providing a prescription for sucralfate which should help coat her stomach.  She is going to talk to the pharmacist about the best timing of the doses of the 2 medications.  There is no indication for imaging at this time as she has no tenderness to palpation of her abdomen, normal vital signs, normal labs.  She understands and agrees with the plan.  She has no indication of an acute issue with her neck either and is neurologically intact throughout.  I gave my usual and customary return precautions.       ____________________________________________  FINAL CLINICAL IMPRESSION(S) / ED DIAGNOSES  Final diagnoses:  Acute gastritis, presence of bleeding unspecified, unspecified gastritis type  NSAID overdose, accidental or unintentional, initial encounter     MEDICATIONS GIVEN DURING THIS VISIT:  Medications - No data to display   NEW OUTPATIENT MEDICATIONS STARTED DURING THIS VISIT:  New Prescriptions   OMEPRAZOLE (PRILOSEC OTC) 20 MG TABLET    Take 1 tablet (20  mg total) by mouth daily.   SUCRALFATE (CARAFATE) 1 G TABLET    Take 1 tablet (1 g total) by mouth 4 (four) times daily as needed (for abdominal discomfort, nausea, and/or vomiting).    Modified Medications   No medications on file    Discontinued Medications   No medications on file     Note:  This document was prepared using Dragon voice recognition software and may include unintentional dictation errors.    Hinda Kehr, MD 07/18/16 1415

## 2016-07-18 NOTE — Telephone Encounter (Signed)
Patient Name: Anne Long DOB: March 09, 1986 Initial Comment Caller having bad chest pain and stomach pain. Nurse Assessment Nurse: Ronnald Ramp, RN, Miranda Date/Time (Eastern Time): 07/18/2016 10:45:15 AM Confirm and document reason for call. If symptomatic, describe symptoms. You must click the next button to save text entered. ---Caller states she has pain in her upper abdomen since Saturday. She vomited x 2 just now. Denies diarrhea or fever. Denies pain in her chest. She has been taking Aleve for back pain the last 3-4 days. She has been taking 3-4 at a time every 12 hrs. Has the patient traveled out of the country within the last 30 days? ---No Does the patient have any new or worsening symptoms? ---Yes Will a triage be completed? ---Yes Related visit to physician within the last 2 weeks? ---No Does the PT have any chronic conditions? (i.e. diabetes, asthma, etc.) ---No Is the patient pregnant or possibly pregnant? (Ask all females between the ages of 28-55) ---No Is this a behavioral health or substance abuse call? ---No Guidelines Guideline Title Affirmed Question Affirmed Notes Poisoning [1] DOUBLE DOSE (an extra dose or lesser amount) of over-the-counter (OTC) drug AND [2] any symptoms (e.g., dizziness, nausea, pain, sleepiness) Final Disposition User Call [facility] [place], RN, Miranda Comments Told caller to contact poison control, but they will probably tell her to go to the ED. If they say it is ok to be seen by MD to call back for an appt. Disagree/Comply: Comply

## 2016-07-18 NOTE — Discharge Instructions (Signed)
As we discussed, we believe that your symptoms are caused by unintentionally taking too much Aleve.  We recommend that he stop taking Aleve at least for the next week or more and instead take Tylenol 1000 mg no more frequently than every 6 hours.  You can continue taking the Flexeril you were previously prescribed.  We recommend that you take an over-the-counter antacid medication in the class of medicines called PPIs, which would include either Nexium, Prilosec, or various others.  The pharmacist can point even in the right direction.  We also recommend that you take the prescribed Carafate as needed and talk to the pharmacist about timing or doses of PPI together with the Carafate.    Return to the emergency department if you develop new or worsening symptoms that concern you.

## 2016-12-13 ENCOUNTER — Encounter (INDEPENDENT_AMBULATORY_CARE_PROVIDER_SITE_OTHER): Payer: Self-pay

## 2016-12-13 ENCOUNTER — Ambulatory Visit (INDEPENDENT_AMBULATORY_CARE_PROVIDER_SITE_OTHER): Payer: BC Managed Care – PPO | Admitting: Family Medicine

## 2016-12-13 ENCOUNTER — Encounter: Payer: Self-pay | Admitting: Family Medicine

## 2016-12-13 VITALS — BP 100/60 | HR 78 | Temp 98.5°F | Wt 140.8 lb

## 2016-12-13 DIAGNOSIS — R202 Paresthesia of skin: Secondary | ICD-10-CM | POA: Diagnosis not present

## 2016-12-13 MED ORDER — PREDNISONE 20 MG PO TABS: ORAL_TABLET | ORAL | 0 refills | Status: DC

## 2016-12-13 NOTE — Progress Notes (Signed)
Pre visit review using our clinic review tool, if applicable. No additional management support is needed unless otherwise documented below in the visit note. 

## 2016-12-13 NOTE — Progress Notes (Signed)
R arm sx.  Pain from the neck down the R arm.  Tingling and painful.  Started about 2 days ago.  No weakness.  No trauma.  No L sided sx.  No leg sx.  No FCNAVD.  She didn't know if she slept wrong.  No rash.    Prev imaging last year neg for similar.  Last took aleve this AM.  Usually used rarely.    Meds, vitals, and allergies reviewed.   ROS: Per HPI unless specifically indicated in ROS section   GEN: nad, alert and oriented NECK: supple w/o LA CV: rrr. PULM: ctab, no inc wob ABD: soft, +bs EXT: no edema SKIN: no acute rash CN2-12 wnl Normal DTRs BUE, altered light touch sensation in the RUE, decreased sensation on the R arm.  No weakness.  Normal shoulder ROM.  Normal S/S grossly BLE

## 2016-12-13 NOTE — Patient Instructions (Addendum)
Stop aleve.  Prednisone with food.  Rosaria Ferries will call about your referral. Take care.  Glad to see you.

## 2016-12-14 NOTE — Assessment & Plan Note (Signed)
Previous imaging unremarkable. Unlikely that imaging at this point would change management. Discussed with patient. She agrees. Stop aleve.  Prednisone with food.  Refer to physical therapy for neck. She agrees. Update [place] as needed. No emergent symptoms. No weakness.

## 2016-12-22 ENCOUNTER — Telehealth: Payer: Self-pay

## 2016-12-22 DIAGNOSIS — R202 Paresthesia of skin: Secondary | ICD-10-CM

## 2016-12-22 NOTE — Telephone Encounter (Signed)
Okay to see chiropractor.  Referral done, but clarify what she means by nerve problems.  Make sure she doesn't have weakness.  If she has true weakness, she needs other intervention.  Thanks.

## 2016-12-22 NOTE — Telephone Encounter (Signed)
Pt left v/m; pt was seen on 12/13/16 and has taken the prednisone; now still having some nerve problems and pt wants to know to relieve pressure on nerve could pt get referral to chiropractor to align spine. Pt request cb.

## 2016-12-23 NOTE — Telephone Encounter (Signed)
Left detailed message on voicemail.  

## 2017-01-19 ENCOUNTER — Encounter: Payer: Self-pay | Admitting: Family Medicine

## 2017-01-19 ENCOUNTER — Encounter: Payer: BC Managed Care – PPO | Admitting: Family Medicine

## 2017-01-19 ENCOUNTER — Ambulatory Visit (INDEPENDENT_AMBULATORY_CARE_PROVIDER_SITE_OTHER): Payer: BC Managed Care – PPO | Admitting: Family Medicine

## 2017-01-19 VITALS — BP 106/74 | HR 89 | Temp 98.5°F | Wt 140.0 lb

## 2017-01-19 DIAGNOSIS — R202 Paresthesia of skin: Secondary | ICD-10-CM

## 2017-01-19 DIAGNOSIS — Z309 Encounter for contraceptive management, unspecified: Secondary | ICD-10-CM | POA: Insufficient documentation

## 2017-01-19 DIAGNOSIS — M542 Cervicalgia: Secondary | ICD-10-CM

## 2017-01-19 DIAGNOSIS — Z0000 Encounter for general adult medical examination without abnormal findings: Secondary | ICD-10-CM | POA: Insufficient documentation

## 2017-01-19 DIAGNOSIS — Z30011 Encounter for initial prescription of contraceptive pills: Secondary | ICD-10-CM

## 2017-01-19 MED ORDER — NORETHINDRONE ACET-ETHINYL EST 1-20 MG-MCG PO TABS: 1.0000 | ORAL_TABLET | Freq: Every day | ORAL | 11 refills | Status: DC

## 2017-01-19 NOTE — Progress Notes (Signed)
Pre visit review using our clinic review tool, if applicable. No additional management support is needed unless otherwise documented below in the visit note. 

## 2017-01-19 NOTE — Assessment & Plan Note (Signed)
With radiculopathy, ongoing for almost a year. Has failed conservative management. Refer to ortho for work up (probable MRI) and management. The patient indicates understanding of these issues and agrees with the plan.

## 2017-01-19 NOTE — Progress Notes (Signed)
Subjective:   Patient ID: Anne Long, female    DOB: [birth year], 31 y.o.   MRN: [id number]  Anne Long is a pleasant 31 y.o. year old female who presents to clinic today with Muscle Pain (right arm about a month ago. Saw Dr Damita Dunnings and was out on prednisone. Also going to PT and Chiropractor. Dx with Bulging Disc. Some new axillary pain started causing arm to hurt)  on 01/19/2017  HPI:  Initially saw Dr. Damita Dunnings for this on 12/13/16.  Note reviewed.  Felt it was aggravation of her cervical impingement/radiculopathy. Placed on course of prednisone and referred to PT.  Also going to chiropractor.  Past few months, having some right arm/hand radiculopathy.  She is taking Advil as needed.  Xray of cervical spine 03/08/16  EXAM: CERVICAL SPINE - COMPLETE 4+ VIEW  COMPARISON:  None.  FINDINGS: There is no evidence of cervical spine fracture or prevertebral soft tissue swelling. Alignment is normal. No other significant bone abnormalities are identified.  IMPRESSION: Negative cervical spine radiographs.  Also now sexually active with one partner and would like to start OCPs.  Periods are regular, not very heavy. Has never taken OCPs in past.  Current Outpatient Prescriptions on File Prior to Visit  Medication Sig Dispense Refill  . omeprazole (PRILOSEC OTC) 20 MG tablet Take 1 tablet (20 mg total) by mouth daily. 28 tablet 1   No current facility-administered medications on file prior to visit.     No Known Allergies  No past medical history on file.  No past surgical history on file.  No family history on file.  Social History   Social History  . Marital status: Single    Spouse name: N/A  . Number of children: N/A  . Years of education: N/A   Occupational History  . Teacher [employer]   Social History Main Topics  . Smoking status: Never Smoker  . Smokeless tobacco: Never Used  . Alcohol use No  . Drug use: No  . Sexual activity:  Not on file   Other Topics Concern  . Not on file   Social History Narrative  . No narrative on file   The PMH, PSH, Social History, Family History, Medications, and allergies have been reviewed in [facility], and have been updated if relevant.   Review of Systems  Constitutional: Negative.   Respiratory: Negative.   Gastrointestinal: Negative.   Genitourinary: Negative.   Musculoskeletal: Positive for neck pain.  Neurological: Positive for numbness. Negative for weakness.  All other systems reviewed and are negative.      Objective:    BP 106/74 (BP Location: Left Arm, Patient Position: Sitting, Cuff Size: Normal)   Pulse 89   Temp 98.5 F (36.9 C) (Oral)   Wt 140 lb (63.5 kg)   SpO2 98%   BMI 27.34 kg/m    Physical Exam  Constitutional: She is oriented to person, place, and time. She appears well-developed and well-nourished. No distress.  HENT:  Head: Normocephalic and atraumatic.  Eyes: Conjunctivae are normal.  Cardiovascular: Normal rate.   Pulmonary/Chest: Effort normal.  Musculoskeletal: Normal range of motion. She exhibits no edema.  Neurological: She is alert and oriented to person, place, and time. She has normal strength. No cranial nerve deficit or sensory deficit.  Skin: Skin is warm and dry. She is not diaphoretic.  Psychiatric: She has a normal mood and affect. Her behavior is normal. Judgment and thought content normal.  Nursing note and vitals reviewed.  Assessment & Plan:   Cervical pain (neck) - Plan: Ambulatory referral to Orthopedic Surgery  Arm paresthesia, right - Plan: Ambulatory referral to Orthopedic Surgery  Encounter for initial prescription of contraceptive pills No Follow-up on file.

## 2017-01-19 NOTE — Assessment & Plan Note (Signed)
Education given regarding options for contraception, including oral contraceptives. eRx sent for loestrin. She will update me over the next few months. Advised using condoms for the next 1-2 weeks. The patient indicates understanding of these issues and agrees with the plan.

## 2017-01-19 NOTE — Patient Instructions (Addendum)
Great to see you.   Please stop by to see Anne Long on your way out.  We are starting loestrin.  Please keep me updated.

## 2017-02-21 ENCOUNTER — Ambulatory Visit (INDEPENDENT_AMBULATORY_CARE_PROVIDER_SITE_OTHER): Payer: BC Managed Care – PPO | Admitting: Family Medicine

## 2017-02-21 ENCOUNTER — Other Ambulatory Visit (HOSPITAL_COMMUNITY)
Admission: RE | Admit: 2017-02-21 | Discharge: 2017-02-21 | Disposition: A | Discharge status: Home / Self Care | Payer: BC Managed Care – PPO | Source: Ambulatory Visit | Attending: Family Medicine | Admitting: Family Medicine

## 2017-02-21 VITALS — BP 98/62 | HR 68 | Ht 60.0 in | Wt 139.0 lb

## 2017-02-21 DIAGNOSIS — Z0000 Encounter for general adult medical examination without abnormal findings: Secondary | ICD-10-CM

## 2017-02-21 DIAGNOSIS — Z30011 Encounter for initial prescription of contraceptive pills: Secondary | ICD-10-CM | POA: Diagnosis not present

## 2017-02-21 DIAGNOSIS — Z124 Encounter for screening for malignant neoplasm of cervix: Secondary | ICD-10-CM

## 2017-02-21 DIAGNOSIS — R87810 Cervical high risk human papillomavirus (HPV) DNA test positive: Secondary | ICD-10-CM | POA: Insufficient documentation

## 2017-02-21 DIAGNOSIS — R87610 Atypical squamous cells of undetermined significance on cytologic smear of cervix (ASC-US): Secondary | ICD-10-CM | POA: Insufficient documentation

## 2017-02-21 LAB — COMPREHENSIVE METABOLIC PANEL
ALT: 12 U/L (ref 0–35)
AST: 15 U/L (ref 0–37)
Albumin: 4.2 g/dL (ref 3.5–5.2)
Alkaline Phosphatase: 39 U/L (ref 39–117)
BUN: 9 mg/dL (ref 6–23)
CO2: 30 mEq/L (ref 19–32)
Calcium: 9.2 mg/dL (ref 8.4–10.5)
Chloride: 102 mEq/L (ref 96–112)
Creatinine, Ser: 0.78 mg/dL (ref 0.40–1.20)
GFR: 111.2 mL/min (ref >60.00)
Glucose, Bld: 87 mg/dL (ref 70–99)
Potassium: 4.1 mEq/L (ref 3.5–5.1)
Sodium: 135 mEq/L (ref 135–145)
Total Bilirubin: 0.4 mg/dL (ref 0.2–1.2)
Total Protein: 7.2 g/dL (ref 6.0–8.3)

## 2017-02-21 LAB — CBC WITH DIFFERENTIAL/PLATELET
Basophils Absolute: 0 10*3/uL (ref 0.0–0.1)
Basophils Relative: 0.7 % (ref 0.0–3.0)
Eosinophils Absolute: 0 10*3/uL (ref 0.0–0.7)
Eosinophils Relative: 0.4 % (ref 0.0–5.0)
HCT: 37 % (ref 36.0–46.0)
Hemoglobin: 12.3 g/dL (ref 12.0–15.0)
Lymphocytes Relative: 38 % (ref 12.0–46.0)
Lymphs Abs: 2.4 10*3/uL (ref 0.7–4.0)
MCHC: 33.2 g/dL (ref 30.0–36.0)
MCV: 83.4 fl (ref 78.0–100.0)
Monocytes Absolute: 0.4 10*3/uL (ref 0.1–1.0)
Monocytes Relative: 7.2 % (ref 3.0–12.0)
Neutro Abs: 3.3 10*3/uL (ref 1.4–7.7)
Neutrophils Relative %: 53.7 % (ref 43.0–77.0)
Platelets: 287 10*3/uL (ref 150.0–400.0)
RBC: 4.44 Mil/uL (ref 3.87–5.11)
RDW: 13.7 % (ref 11.5–15.5)
WBC: 6.2 10*3/uL (ref 4.0–10.5)

## 2017-02-21 LAB — LIPID PANEL
Cholesterol: 160 mg/dL (ref 0–200)
HDL: 76.7 mg/dL (ref >39.00)
LDL Cholesterol: 73 mg/dL (ref 0–99)
NonHDL: 83.15
Total CHOL/HDL Ratio: 2
Triglycerides: 51 mg/dL (ref 0.0–149.0)
VLDL: 10.2 mg/dL (ref 0.0–40.0)

## 2017-02-21 LAB — TSH: TSH: 1.46 u[IU]/mL (ref 0.35–4.50)

## 2017-02-21 NOTE — Patient Instructions (Signed)
Great to see you.  We will call you with your results and you can view them online. 

## 2017-02-21 NOTE — Progress Notes (Signed)
Pre visit review using our clinic review tool, if applicable. No additional management support is needed unless otherwise documented below in the visit note. 

## 2017-02-21 NOTE — Assessment & Plan Note (Signed)
Reviewed preventive care protocols, scheduled due services, and updated immunizations Discussed nutrition, exercise, diet, and healthy lifestyle.  Pap smear done today, along with STD testing.  Orders Placed This Encounter  Procedures  . CBC with Differential/Platelet  . Comprehensive metabolic panel  . Lipid panel  . TSH  . HIV antibody (with reflex)  . RPR

## 2017-02-21 NOTE — Assessment & Plan Note (Signed)
Pleased with current ocps.  No changes made today.

## 2017-02-21 NOTE — Progress Notes (Signed)
Subjective:   Patient ID: Anne Long, female    DOB: 06-Jul-1986, 31 y.o.   MRN: [id number]  Polly Barner is a pleasant 31 y.o. year old female who presents to clinic today with Annual Exam  on 02/21/2017  HPI:  Pleased with current OCPs  Diet: Endorses fair diet. Breakfast: Skips Lunch: [employer] at her school. Dinner: Fast food, baked lean meats, vegetables. 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 Dental exam: Completed 1 year ago. Pap Smear: 04/29/15.  Current Outpatient Prescriptions on File Prior to Visit  Medication Sig Dispense Refill  . norethindrone-ethinyl estradiol (LOESTRIN 1/20, 21,) 1-20 MG-MCG tablet Take 1 tablet by mouth daily. 1 Package 11   No current facility-administered medications on file prior to visit.     Allergies  Allergen Reactions  . Other     No past medical history on file.  No past surgical history on file.  No family history on file.  Social History   Social History  . Marital status: Single    Spouse name: N/A  . Number of children: N/A  . Years of education: N/A   Occupational History  . Teacher [employer]   Social History Main Topics  . Smoking status: Never Smoker  . Smokeless tobacco: Never Used  . Alcohol use No  . Drug use: No  . Sexual activity: Not on file   Other Topics Concern  . Not on file   Social History Narrative  . No narrative on file   The PMH, PSH, Social History, Family History, Medications, and allergies have been reviewed in [facility], and have been updated if relevant.   Review of Systems  Constitutional: Negative.   HENT: Negative.   Eyes: Negative.   Respiratory: Negative.   Cardiovascular: Negative.   Gastrointestinal: Negative.   Endocrine: Negative.   Genitourinary: Negative.   Musculoskeletal: Negative.   Skin: Negative.   Allergic/Immunologic: Negative.   Neurological: Negative.   Hematological: Negative.   Psychiatric/Behavioral: Negative.    All other systems reviewed and are negative.      Objective:    BP 98/62   Pulse 68   Ht 5' (1.524 m)   Wt 139 lb (63 kg)   LMP 02/14/2017   SpO2 99%   BMI 27.15 kg/m    Physical Exam   General:  Well-developed,well-nourished,in no acute distress; alert,appropriate and cooperative throughout examination Head:  normocephalic and atraumatic.   Eyes:  vision grossly intact, PERRL Ears:  R ear normal and L ear normal externally, TMs clear bilaterally Nose:  no external deformity.   Mouth:  good dentition.   Neck:  No deformities, masses, or tenderness noted. Breasts:  No mass, nodules, thickening, tenderness, bulging, retraction, inflamation, nipple discharge or skin changes noted.   Lungs:  Normal respiratory effort, chest expands symmetrically. Lungs are clear to auscultation, no crackles or wheezes. Heart:  Normal rate and regular rhythm. S1 and S2 normal without gallop, murmur, click, rub or other extra sounds. Abdomen:  Bowel sounds positive,abdomen soft and non-tender without masses, organomegaly or hernias noted. Rectal:  no external abnormalities.   Genitalia:  Pelvic Exam:        External: normal female genitalia without lesions or masses        Vagina: normal without lesions or masses        Cervix: normal without lesions or masses        Adnexa: normal bimanual exam without masses or fullness  Uterus: normal by palpation        Pap smear: performed Msk:  No deformity or scoliosis noted of thoracic or lumbar spine.   Extremities:  No clubbing, cyanosis, edema, or deformity noted with normal full range of motion of all joints.   Neurologic:  alert & oriented X3 and gait normal.   Skin:  Intact without suspicious lesions or rashes Cervical Nodes:  No lymphadenopathy noted Axillary Nodes:  No palpable lymphadenopathy Psych:  Cognition and judgment appear intact. Alert and cooperative with normal attention span and concentration. No apparent delusions,  illusions, hallucinations       Assessment & Plan:   Preventative health care  Encounter for initial prescription of contraceptive pills No Follow-up on file.

## 2017-02-22 LAB — RPR

## 2017-02-22 LAB — HIV ANTIBODY (ROUTINE TESTING W REFLEX): HIV 1&2 Ab, 4th Generation: NONREACTIVE

## 2017-02-23 LAB — CERVICOVAGINAL ANCILLARY ONLY: Herpes: NEGATIVE

## 2017-02-24 ENCOUNTER — Other Ambulatory Visit: Payer: Self-pay | Admitting: Family Medicine

## 2017-02-24 DIAGNOSIS — R87810 Cervical high risk human papillomavirus (HPV) DNA test positive: Secondary | ICD-10-CM

## 2017-02-24 DIAGNOSIS — R87610 Atypical squamous cells of undetermined significance on cytologic smear of cervix (ASC-US): Secondary | ICD-10-CM

## 2017-02-24 LAB — CYTOLOGY - PAP
Chlamydia: NEGATIVE
Diagnosis: UNDETERMINED — AB
HPV 16/18/45 genotyping: NEGATIVE
HPV: DETECTED — AB
Neisseria Gonorrhea: NEGATIVE

## 2017-03-13 ENCOUNTER — Ambulatory Visit (INDEPENDENT_AMBULATORY_CARE_PROVIDER_SITE_OTHER): Payer: BC Managed Care – PPO | Admitting: Obstetrics & Gynecology

## 2017-03-13 ENCOUNTER — Encounter: Payer: Self-pay | Admitting: Obstetrics & Gynecology

## 2017-03-13 VITALS — BP 106/62 | HR 68 | Ht 60.0 in | Wt 140.0 lb

## 2017-03-13 DIAGNOSIS — R87810 Cervical high risk human papillomavirus (HPV) DNA test positive: Secondary | ICD-10-CM | POA: Diagnosis not present

## 2017-03-13 DIAGNOSIS — R87610 Atypical squamous cells of undetermined significance on cytologic smear of cervix (ASC-US): Secondary | ICD-10-CM | POA: Diagnosis not present

## 2017-03-13 NOTE — Patient Instructions (Signed)

## 2017-03-13 NOTE — Progress Notes (Signed)
    GYNECOLOGY CLINIC COLPOSCOPY PROCEDURE NOTE  31 y.o. G0P0000 here for colposcopy for ASCUS with POSITIVE high risk HPV pap smear on 02/21/2017. Discussed role for HPV in cervical dysplasia, need for surveillance.  Patient given informed consent, signed copy in the chart, time out was performed.  Placed in lithotomy position. Cervix viewed with speculum and colposcope after application of acetic acid.   Colposcopy adequate? Yes Acetowhite lesion(s) noted at 1 and 6 o'clock; corresponding biopsies obtained.  ECC specimen obtained. All specimens were labeled and sent to pathology.   Patient was given post procedure instructions.  Will follow up pathology and manage accordingly; patient will be contacted with results and recommendations.  Routine preventative health maintenance measures emphasized.   Verita Schneiders, MD, [place] Attending [place], [facility] for [employer], [place]

## 2017-03-19 ENCOUNTER — Encounter: Payer: Self-pay | Admitting: Obstetrics & Gynecology

## 2017-03-19 DIAGNOSIS — N871 Moderate cervical dysplasia: Secondary | ICD-10-CM

## 2017-03-19 HISTORY — DX: Moderate cervical dysplasia: N87.1

## 2017-03-20 ENCOUNTER — Telehealth: Payer: Self-pay | Admitting: *Deleted

## 2017-03-20 NOTE — Telephone Encounter (Signed)
-----   Message from Osborne Oman, MD sent at 03/19/2017  1:06 PM EDT ----- 03/13/2017 Colposcopy Pathology Diagnosis 1. Cervix, biopsy, 1:00 o'clock - MUCUS AND INFLAMMATORY CELLS. - NO EPITHELIAL TISSUE IDENTIFIED. 2. 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 3. Endocervix, curettage - SCATTERED MINUTE FRAGMENTS OF BENIGN ENDOCERVICAL GLANDULAR TISSUE.  Given CIN II, patient needs intervention. Cryotherapy recommended.  Please call to inform patient of results and recommendations; she can also make appointment to discuss the results/recommendations prior to procedure.

## 2017-03-20 NOTE — Telephone Encounter (Signed)
Informed pt of results and the recommendation to move forward with the cryotherapy procedure.  Explained procedure and scheduled appt for 04-11-17 at 345 with Dr Harolyn Rutherford.  Pt acknowledged instructions.

## 2017-04-11 ENCOUNTER — Encounter: Payer: BC Managed Care – PPO | Admitting: Obstetrics & Gynecology

## 2017-05-12 ENCOUNTER — Encounter: Payer: BC Managed Care – PPO | Admitting: Obstetrics & Gynecology

## 2017-06-05 ENCOUNTER — Encounter: Payer: Self-pay | Admitting: Obstetrics & Gynecology

## 2017-06-05 ENCOUNTER — Ambulatory Visit (INDEPENDENT_AMBULATORY_CARE_PROVIDER_SITE_OTHER): Payer: BC Managed Care – PPO | Admitting: Obstetrics & Gynecology

## 2017-06-05 VITALS — BP 111/71 | HR 65 | Ht 60.0 in | Wt 143.0 lb

## 2017-06-05 DIAGNOSIS — N871 Moderate cervical dysplasia: Secondary | ICD-10-CM | POA: Diagnosis not present

## 2017-06-05 NOTE — Patient Instructions (Signed)
Cervical cryotherapy is a procedure which involves freezing an area of abnormal tissue on the cervix. This tissue gradually disappears and the cervix heals. One cervical cryotherapy is usually sufficient to destroy the abnormal tissue.  Purpose  Cervical cryotherapy is a standard method used to treat cervical dysplasia, meaning the removal of abnormal cell tissue on the cervix.  Description  Cervical cryotherapy, or freezing, usually lasts about five minutes and causes a slight amount of discomfort. The procedure is usually performed in an outpatient setting.  Cervical cryotherapy is done by placing a small freeze-probe (cryoprobe) against the cervix that cools the cervix to sub-zero temperatures. The cells destroyed by freezing are shed afterwards in a heavy watery discharge. The main advantage of cryotherapy is that it is a simple procedure that requires inexpensive equipment.  The cryogenic device consists of a gas tank containing a refrigerant and non-explosive, non-toxic gas (usually nitrous oxide). The gas is delivered using flexible tubing through a gun-type attachment to the cryoprobe.  Diagnosis/Preparation  Women who undergo cervical cryotherapy typically have had an abnormal Pap smear which has led to a diagnosis of cervical squamous dysplasia and usually confirmed by biopsy after an adequate colposcopic exam.  Preparation for cervical cryotherapy involves scheduling the procedure when the patient is not experiencing heavy menstrual flow. Ibuprofen or naproxen sodium may be given before cryotherapy to decrease cramping. If there is any doubt about the pregnancy status, a pregnancy test is performed.  Aftercare  Cervical cryotherapy is often followed by a heavy and often odorous discharge during the first month after the procedure. The discharge is due to the dead tissue cells leaving the treatment site. The patient should abstain from sexual intercourse and not use tampons for a period of two  weeks after the procedure. Excessive exercise should also be avoided to lessen the occurrence of post-therapy bleeding.  Risks  The following risks have been associated with cervical cryotherapy:  Uterine cramping. Often occurs during the cryotherapy but rapidly subsides after treatment.  Bleeding and infection. Rare, but incidences have been reported.  More difficult Pap smears. Future Pap smears and colposcopy may be more difficult after cryotherapy.  Normal results  A normal result is no recurrence of the abnormal cervix cells. The first follow-up Pap smear is done at 12 months then 24 months after the procedure. If normal, patients resume usual pap smear screening.  If any, recurrences usually occur within two years of treatment.  If a follow-up Pap smear is abnormal, a colposcopy with biopsy is usually performed. Other treatment methods, usually the loop electrocautery excision procedure (LEEP) are then used if persistent disease is discovered.  Following the procedure, it is considered normal to experience the following:  slight cramping for two to three days  watery discharge requiring several pad changes daily  Bloody or brown discharge, especially 12-16 days after the procedure  Alternatives  Loop electrocautery excision procedure (LEEP). This procedure uses a fine wire loop with an electric current flowing through it to remove the desired area of the cervix. Loop excision is usually done under local anesthesia and causes very little discomfort.

## 2017-06-05 NOTE — Progress Notes (Signed)
    GYNECOLOGY CLINIC PROCEDURE NOTE  Cryotherapy details  Indication: CIN II pathology after colposcopy on 03/13/2017 after ASCUS with POSITIVE high risk HPV pap on 02/21/2017  The indications for cryotherapy were reviewed with the patient in detail. She was counseled about that efficacy of this procedure, and possible need for excisional procedure in the future if her cervical dysplasia persists.  The risks of the procedure where explained in detail and patient was told to expect a copious amount of discharge in the next few weeks. All her questions were answered, and written informed consent was obtained.  The patient was placed in the dorsal lithotomy position and a vaginal speculum was placed. Her cervix was visualized and patient was noted to have had normal size transformation zone. The appropriate cryotherapy probe was picked and affixed to cryotherapy apparatus. Then nitrogen gas was then activated, the probe was coated with lubricating jelly and applied to the transformation zone of the cervix. This was kept in place for 3 minutes. The cryotherapy was then stopped and all instruments were removed from the patient's pelvis; a thawing period of 3 minutes was observed.  A second cycle of cryotherapy was then administered to the cervix for 3 minutes.  The patient tolerated the procedure well without any complications. Routine post procedure instructions were given to the patient.  Will repeat pap smear in 6 months and manage accordingly.   Verita Schneiders, MD, [place] Attending [place], [facility] for [employer], [place]

## 2017-06-07 ENCOUNTER — Encounter: Payer: Self-pay | Admitting: *Deleted

## 2017-12-15 ENCOUNTER — Other Ambulatory Visit: Payer: Self-pay | Admitting: Family Medicine

## 2018-04-21 ENCOUNTER — Other Ambulatory Visit: Payer: Self-pay | Admitting: Family Medicine

## 2018-11-01 ENCOUNTER — Telehealth: Payer: Self-pay

## 2018-11-01 NOTE — Telephone Encounter (Signed)
This is a Dr Deborra Medina pt; unable to reach pt by phone. Sent note to [place].

## 2018-11-01 NOTE — Telephone Encounter (Signed)
[facility] Patient Name: Anne Long Gender: Female DOB: 12/09/1985 Age: 33 Y 1 M 12 D Return Phone Number: [phone] (Primary) Address[place] [address] Client [place] Night - Client Client [place] Physician Arnette Norris- MD Contact Type Call Who Is Calling Patient / Member / Family / Caregiver Call Type Triage / Clinical Caller Name Relationship To Patient Self Return Phone Number [phone] (Primary) Chief Complaint Sore Throat Reason for Call Request to Schedule Office Appointment Initial Comment Caller states she wants an appt. Her nose is stuffy, sore throat, can feel drainage from nose in her throat, abdominal feels funny (not pain) but it feels empty, but when she eat she doesnt feel anything, and nauseous. Translation No Nurse Assessment Nurse: Toribio Harbour, RN, [place] Date/Time (Eastern Time): 10/31/2018 6:29:35 PM Confirm and document reason for call. If symptomatic, describe symptoms. ---Caller states she wants an appt. Her nose is stuffy, sore throat, has had a cough for over a week now and can feel drainage from nose in her throat, abdominal feels funny (not pain) but it feels empty - feels hungry but does not feel satisfied by eating, but when she eat she doesn't feel anything, and nauseous. Caller denies taking temp - no thermometer. Has the patient traveled to [place] OR had close contact with a person known to have the novel coronavirus illness in the last 14 days? ---No Does the patient have any new or worsening symptoms? ---Yes Will a triage be completed? ---Yes Related visit to physician within the last 2 weeks? ---No Does the PT have any chronic conditions? (i.e. diabetes, asthma, this includes High risk factors for pregnancy, etc.) ---No Is the patient pregnant or possibly  pregnant? (Ask all females between the ages of 89-55) ---No Is this a behavioral health or substance abuse call? ---No Guidelines Guideline Title Affirmed Question Affirmed Notes Nurse Date/Time Eilene Ghazi Time) Cough - Acute Productive Cough with cold symptoms (e.g., runny Toribio Harbour, RN, [facility] 10/31/2018 6:32:19 PM PLEASE NOTE: All timestamps contained within this report are represented as [place] Standard Time. CONFIDENTIALTY NOTICE: This fax transmission is intended only for the addressee. It contains information that is legally privileged, confidential or otherwise protected from use or disclosure. If you are not the intended recipient, you are strictly prohibited from reviewing, disclosing, copying using or disseminating any of this information or taking any action in reliance on or regarding this information. If you have received this fax in error, please notify us immediately by telephone so that we can arrange for its return to [place]. Phone: [phone], Toll-Free: [phone], Fax: [phone] Page: 2 of 2 Call Id: [id number] Guidelines Guideline Title Affirmed Question Affirmed Notes Nurse Date/Time Eilene Ghazi Time) nose, postnasal drip, throat clearing) Disp. Time Eilene Ghazi Time) Disposition Final User 10/31/2018 6:14:06 PM Send To RN Personal Lilia Pro, RN, Todd 10/31/2018 6:14:40 PM Send To RN Personal Lilia Pro, RN, Todd 10/31/2018 6:35:44 PM Home Care Yes Toribio Harbour, RN, Delphina Cahill Disagree/Comply Comply Caller Understands Yes PreDisposition Call Doctor Care Advice Given Per Guideline HOME CARE: * You should be able to treat this at home. REASSURANCE AND EDUCATION: * It sounds like an uncomplicated cold that we can treat at home. * Colds are very common and may make you feel uncomfortable. * Colds are caused by viruses, and no medicine or 'shot' will cure an uncomplicated cold. Colds are usually not serious. * HOME REMEDY -  HONEY: This old home remedy has been shown to help decrease  coughing at night. The adult dosage is 2 teaspoons (10 ml) at bedtime. Honey should not be given to infants under one year of age. FOR A RUNNY NOSE - BLOW YOUR NOSE: * Nasal mucus and discharge help wash viruses and bacteria out of the nose and sinuses. * Treat fevers above 101 F (38.3 C). CONTAGIOUSNESS: * The cold virus is present in your nasal secretions. * Cover your nose and mouth with a tissue when you sneeze or cough. Wash your hands frequently. CALL BACK IF: * Fever lasts over 3 days * Nasal discharge lasts over 10 days * Earache or facial pain develops * You become worse. CARE ADVICE given per Cough - Acute Productive (Adult) guideline.

## 2018-11-05 NOTE — Telephone Encounter (Signed)
Spoke with patient and she would like to continue at [employer]. I will reached out to [facility] to see which doctors are excepting new pts and process the TOC.

## 2018-11-07 ENCOUNTER — Encounter: Payer: Self-pay | Admitting: Family Medicine

## 2018-11-07 ENCOUNTER — Other Ambulatory Visit (HOSPITAL_COMMUNITY)
Admission: RE | Admit: 2018-11-07 | Discharge: 2018-11-07 | Disposition: A | Discharge status: Home / Self Care | Payer: BC Managed Care – PPO | Source: Ambulatory Visit | Attending: Family Medicine | Admitting: Family Medicine

## 2018-11-07 ENCOUNTER — Ambulatory Visit (INDEPENDENT_AMBULATORY_CARE_PROVIDER_SITE_OTHER): Payer: BC Managed Care – PPO | Admitting: Family Medicine

## 2018-11-07 VITALS — BP 110/82 | HR 80 | Temp 98.5°F | Resp 14 | Ht 60.0 in | Wt 136.0 lb

## 2018-11-07 DIAGNOSIS — Z124 Encounter for screening for malignant neoplasm of cervix: Secondary | ICD-10-CM

## 2018-11-07 DIAGNOSIS — R05 Cough: Secondary | ICD-10-CM | POA: Diagnosis not present

## 2018-11-07 DIAGNOSIS — R059 Cough, unspecified: Secondary | ICD-10-CM

## 2018-11-07 DIAGNOSIS — Z7689 Persons encountering health services in other specified circumstances: Secondary | ICD-10-CM

## 2018-11-07 MED ORDER — BENZONATATE 100 MG PO CAPS: 100.0000 mg | ORAL_CAPSULE | Freq: Three times a day (TID) | ORAL | 0 refills | Status: DC | PRN

## 2018-11-07 NOTE — Progress Notes (Signed)
Subjective:    Patient ID: Anne Long, female    DOB: 02/07/1986, 33 y.o.   MRN: [id number]  HPI This is a 33 yo female who presents today to establish care and for acute visit. She was a previous patient of Dr. Deborra Medina. 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 She lives alone and is engaged.   She had an upper respiratory virus and cough won't go away. No fever, occasional wheeze, no SOB, no fatigue. No nasal drainage, feels like something is in her throat, possibly some post nasal drainage. Was taking Delsym with some relief but switched to Mucinex DM which seems to make her cough more. Talking triggers cough. Occasional seasonal allergies. Otherwise feels well.   Last CPE- 02/21/17 Pap- 6/12//18- ASCUS with positive high risk HPV, underwent colposcopy and cryotherapy, did not follow up for repeat pap. Sexually active on OCPs, no discharge, itching/burning, pain with intercourse.  Td- 06/25/08 Flu- annual Eye- unsure Dental- regular Exercise- regular with job    No past medical history on file. No past surgical history on file. Family History  Problem Relation Age of Onset  . Ovarian cancer Maternal Grandmother 37   Social History   Tobacco Use  . Smoking status: Never Smoker  . Smokeless tobacco: Never Used  Substance Use Topics  . Alcohol use: No  . Drug use: No      Review of Systems    per HPI Objective:   Physical Exam Vitals signs reviewed. Exam conducted with a chaperone present.  Constitutional:      General: She is not in acute distress.    Appearance: Normal appearance. She is normal weight. She is not ill-appearing, toxic-appearing or diaphoretic.  HENT:     Head: Normocephalic and atraumatic.     Right Ear: Tympanic membrane, ear canal and external ear normal.     Left Ear: Tympanic membrane, ear canal and external ear normal.     Nose: Nose normal.     Mouth/Throat:     Mouth: Mucous membranes are moist.     Pharynx: Oropharynx is  clear. No oropharyngeal exudate or posterior oropharyngeal erythema.  Eyes:     Conjunctiva/sclera: Conjunctivae normal.  Neck:     Musculoskeletal: Normal range of motion and neck supple. No neck rigidity or muscular tenderness.  Cardiovascular:     Rate and Rhythm: Normal rate and regular rhythm.     Heart sounds: Normal heart sounds.  Pulmonary:     Effort: Pulmonary effort is normal.     Breath sounds: Normal breath sounds.  Genitourinary:    General: Normal vulva.     Exam position: Supine.     Pubic Area: No rash.      Labia:        Right: No rash, tenderness, lesion or injury.        Left: No rash, tenderness, lesion or injury.      Vagina: Vaginal discharge (thin white) present.        Comments: Small amount of bleeding after obtaining endocervical sample with brush.  Lymphadenopathy:     Cervical: No cervical adenopathy.  Skin:    General: Skin is warm and dry.  Neurological:     Mental Status: She is alert and oriented to person, place, and time.  Psychiatric:        Mood and Affect: Mood normal.        Behavior: Behavior normal.        Thought  Content: Thought content normal.        Judgment: Judgment normal.          BP 110/82 (BP Location: Right Arm, Patient Position: Sitting, Cuff Size: Normal)   Pulse 80   Temp 98.5 F (36.9 C) (Oral)   Resp 14   Ht 5' (1.524 m)   Wt 136 lb (61.7 kg)   LMP 10/24/2018 (Approximate)   SpO2 98%   BMI 26.56 kg/m   Assessment & Plan:  1. Screening for cervical cancer - Cytology - PAP([name])- copy of results routed to Dr. Harolyn Rutherford who did her colposcopy and cryotherapy  2. Cough - Provided written and verbal information regarding diagnosis and treatment. - benzonatate (TESSALON) 100 MG capsule; Take 1-2 capsules (100-200 mg total) by mouth 3 (three) times daily as needed.  Dispense: 30 capsule; Refill: 0   Patient Instructions  Good to see you today  I have sent in a cough pill to your pharmacy, also take  a daily over the counter antihistamine as needed (like cetirizine, loratadine)  I will notify you of pap test results  3. Encounter to establish care - encouraged her to follow up annually for well woman exam   Clarene Reamer, FNP-BC  [facility] at [facility], [facility]  11/07/2018 12:42 PM

## 2018-11-07 NOTE — Patient Instructions (Signed)
Good to see you today  I have sent in a cough pill to your pharmacy, also take a daily over the counter antihistamine as needed (like cetirizine, loratadine)  I will notify you of pap test results

## 2018-11-09 LAB — CYTOLOGY - PAP
Diagnosis: NEGATIVE
HPV: NOT DETECTED

## 2019-02-27 ENCOUNTER — Telehealth: Payer: Self-pay

## 2019-02-27 ENCOUNTER — Other Ambulatory Visit: Payer: Self-pay | Admitting: Family Medicine

## 2019-02-27 MED ORDER — NORETHINDRONE ACET-ETHINYL EST 1-20 MG-MCG PO TABS: 1.0000 | ORAL_TABLET | Freq: Every day | ORAL | 4 refills | Status: DC

## 2019-02-27 NOTE — Telephone Encounter (Signed)
Prescription sent to patient's pharmacy.

## 2019-02-27 NOTE — Telephone Encounter (Signed)
DG-Pharm req refill of BCP Junel/thx dmf

## 2019-11-09 ENCOUNTER — Ambulatory Visit: Payer: BC Managed Care – PPO

## 2019-11-09 ENCOUNTER — Ambulatory Visit: Payer: BC Managed Care – PPO | Attending: Internal Medicine

## 2019-11-09 DIAGNOSIS — Z23 Encounter for immunization: Secondary | ICD-10-CM | POA: Insufficient documentation

## 2019-11-09 NOTE — Progress Notes (Signed)
   Covid-19 Vaccination Clinic  Name:  Anne Long    MRN: [id number] DOB: November 21, 1985  11/09/2019  Anne Long was observed post Covid-19 immunization for 15 minutes without incidence. She was provided with Vaccine Information Sheet and instruction to access the V-Safe system.   Anne Long was instructed to call 911 with any severe reactions post vaccine: Marland Kitchen Difficulty breathing  . Swelling of your face and throat  . A fast heartbeat  . A bad rash all over your body  . Dizziness and weakness    Immunizations Administered    Name Date Dose VIS Date Route   Moderna COVID-19 Vaccine 11/09/2019  3:12 AM 0.5 mL 08/13/2019 Intramuscular   Manufacturer: Moderna   Lot: [id number]   [place]:[id number]

## 2019-12-07 ENCOUNTER — Ambulatory Visit: Payer: BC Managed Care – PPO | Attending: Internal Medicine

## 2019-12-07 DIAGNOSIS — Z23 Encounter for immunization: Secondary | ICD-10-CM

## 2019-12-07 NOTE — Progress Notes (Signed)
   Covid-19 Vaccination Clinic  Name:  Posey Jacobe    MRN: [id number] DOB: 17-Mar-1986  12/07/2019  Ms. Stann Mainland was observed post Covid-19 immunization for 15 minutes without incident. She was provided with Vaccine Information Sheet and instruction to access the V-Safe system.   Ms. Stann Mainland was instructed to call 911 with any severe reactions post vaccine: Marland Kitchen Difficulty breathing  . Swelling of face and throat  . A fast heartbeat  . A bad rash all over body  . Dizziness and weakness   Immunizations Administered    Name Date Dose VIS Date Route   Moderna COVID-19 Vaccine 12/07/2019 12:27 PM 0.5 mL 08/13/2019 Intramuscular   Manufacturer: Levan Hurst   Lot: [id number]   [place]:[id number]

## 2020-10-09 ENCOUNTER — Telehealth: Payer: Self-pay | Admitting: Family Medicine

## 2020-10-09 NOTE — Telephone Encounter (Signed)
Pt reports symptoms starting on 09/27/20 and quarantining since then since her work was cancelled anyway due to the weather. Pt just tested today. Advised she could test positive for some time after being diagnosed and not be contagious. Pt reports only symptom as a scratchy throat currently and denies any other symptoms. Advised pt it is ok to stop quarantine but if she develops new symptoms she should contact office. Pt verbalized understanding.

## 2020-10-09 NOTE — Telephone Encounter (Signed)
Patient was demonstrating cold like symptoms last week. She was having drainage and congestion. She has been in quarantine for almost 10 days now. She took a at home covid test and it came back positive. Patient has some general questions about covid and how long she need to stay in quarantine. EM

## 2020-10-09 NOTE — Telephone Encounter (Signed)
Noted. Thanks.

## 2020-10-26 ENCOUNTER — Encounter: Payer: Self-pay | Admitting: Family Medicine

## 2020-10-26 ENCOUNTER — Ambulatory Visit: Payer: BC Managed Care – PPO | Admitting: Family Medicine

## 2020-10-26 ENCOUNTER — Other Ambulatory Visit: Payer: Self-pay

## 2020-10-26 VITALS — BP 92/58 | HR 85 | Temp 97.5°F | Ht 60.0 in | Wt 147.0 lb

## 2020-10-26 DIAGNOSIS — Z3490 Encounter for supervision of normal pregnancy, unspecified, unspecified trimester: Secondary | ICD-10-CM | POA: Diagnosis not present

## 2020-10-26 LAB — POCT URINE PREGNANCY: Preg Test, Ur: POSITIVE — AB

## 2020-10-26 MED ORDER — PYRIDOXINE HCL 25 MG PO TABS: 12.5000 mg | ORAL_TABLET | Freq: Three times a day (TID) | ORAL | 1 refills | Status: DC | PRN

## 2020-10-26 NOTE — Progress Notes (Signed)
This visit occurred during the SARS-CoV-2 public health emergency.  Safety protocols were in place, including screening questions prior to the visit, additional usage of staff PPE, and extensive cleaning of exam room while observing appropriate contact time as indicated for disinfecting solutions.  Medical laboratory scientific officer at [employer].    She had covid vaccine, with illness last month.  She has some residual cough but is clearly better.    She isn't lightheaded.  D/w pt.    She had positive UPREG 10/12/20.  LMP was 09/16/20.  H/o regular periods. No children.    No dysuria.  No fevers.  No discharge or bleeding.  Some occ lower abd pressure/bloating.  Last BM was the day before yesterday or the day prior.    She has some morning sickness.  She has tendency for nausea at baseline, prior to pregnancy.  No vomiting.   Safe at home.  Happy about pregnancy.  Meds, vitals, and allergies reviewed.   ROS: Per HPI unless specifically indicated in ROS section   GEN: nad, alert and oriented HEENT: ncat NECK: supple w/o LA CV: rrr.  no murmur PULM: ctab, no inc wob ABD: soft, +bs EXT: no edema SKIN: Well-perfused.

## 2020-10-26 NOTE — Patient Instructions (Signed)
Congrats!  We'll call about the OB appointment.  Go to the lab on the way out.   If you have mychart we'll likely use that to update you.    Try B6 as needed.  Take care.  Glad to see you. Okay with me to cancel your upcoming appointment if that is okay with you.

## 2020-10-28 DIAGNOSIS — Z3490 Encounter for supervision of normal pregnancy, unspecified, unspecified trimester: Secondary | ICD-10-CM | POA: Insufficient documentation

## 2020-10-28 NOTE — Assessment & Plan Note (Addendum)
Congratulations given to patient.  We talked about taking a prenatal vitamin.  She wanted to switch to the Gummies and that should be fine.  She does not smoke.  Refer to OB/GYN clinic.  She will need follow-up Pap smear, discussed.  Safe at home.  We talked about continuing exercise during pregnancy.  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  Reasonable to use B6 as needed for nausea.  She will update me if that is not helping.  Repeat pregnancy test done at office visit.  Routine cautions given to patient.  She will update me as needed.  Reasonable to increase fiber intake to see if that helps with likely constipation.

## 2020-11-12 ENCOUNTER — Ambulatory Visit: Payer: BC Managed Care – PPO | Admitting: Family Medicine

## 2020-11-23 ENCOUNTER — Telehealth: Payer: Self-pay | Admitting: *Deleted

## 2020-11-23 NOTE — Telephone Encounter (Signed)
Patient notified as instructed by telephone. Patient stated that she will head over to the [facility] next to [facility].

## 2020-11-23 NOTE — Telephone Encounter (Signed)
Rec: eval tonight given h/o pregnancy.

## 2020-11-23 NOTE — Telephone Encounter (Addendum)
Patient called stating that she started earlier in the day with some low back pain. Patient stated that she is about [redacted] weeks pregnant. Patient stated that she is having pain on both sides of her hip in the area of her ovaries.Patient stated that the pain comes and goes. Patient stated that when the pain started it lasted about 15 minutes and the pain level has been about a 3-4. Patient denies any spotting or bleeding. Patient denies any burning or pain with urinating. Patient stated that she is not having any UTI symptoms. Patient wants to know what Dr. Damita Dunnings would recommend that she do. Patient stated that she does not have an appointment scheduled with an OB/GYN doctor until 12/09/20.

## 2020-11-24 NOTE — Telephone Encounter (Signed)
Please get update on patient.  Thanks. 

## 2020-11-24 NOTE — Telephone Encounter (Signed)
LMTCB

## 2020-11-25 NOTE — Telephone Encounter (Signed)
Spoke with patient and she states she is doing better. She did go to UC and they told her this was normal with her body starting to make room for the baby. She will see OBGYN on 12/09/20.

## 2020-12-04 ENCOUNTER — Telehealth: Payer: Self-pay | Admitting: *Deleted

## 2020-12-04 NOTE — Telephone Encounter (Signed)
Pt called to make [place] aware she had SAB and was seen in ED in [place] last night. She has new OB appt this coming Wednesday and was told to follow up with provider. Pt asking if she can still come in Wednesday or sooner or what POC should be. Informed pt I will discuss with provider and will call her back Monday. Bleeding precautions given and to go back to ED if needed. Pt verbalizes and understands

## 2020-12-09 ENCOUNTER — Ambulatory Visit (INDEPENDENT_AMBULATORY_CARE_PROVIDER_SITE_OTHER): Payer: BC Managed Care – PPO | Admitting: Student

## 2020-12-09 ENCOUNTER — Other Ambulatory Visit: Payer: Self-pay

## 2020-12-09 ENCOUNTER — Encounter: Payer: Self-pay | Admitting: Student

## 2020-12-09 VITALS — BP 115/78 | HR 85 | Wt 149.0 lb

## 2020-12-09 DIAGNOSIS — O039 Complete or unspecified spontaneous abortion without complication: Secondary | ICD-10-CM | POA: Diagnosis not present

## 2020-12-09 DIAGNOSIS — O3680X0 Pregnancy with inconclusive fetal viability, not applicable or unspecified: Secondary | ICD-10-CM | POA: Diagnosis not present

## 2020-12-09 NOTE — Progress Notes (Signed)
  History:  [name] is a 35 y.o. G1P0010 who presents to clinic today for follow up from SAB last week. States that she was seen in [place] at [facility] for vaginal bleeding, was diagnosed with miscarriage and given cytotec. Beta HCG that day was 4300. On Saturday, March 26, patient was driving home from [place] and stopped at [facility] as she was still having pain and bleeding. She had an [place] (per patient) but waited over 9 hours and was not seen. She ultimately opted to leave and keep her appt today.   Today she reports light bleeding and no pain. Some soreness in her waist; no clots, no unilateral pain. She is eating regularly and ambulating without difficulty. She says that she is feeing much better than she did over the weekend.   The following portions of the patient's history were reviewed and updated as appropriate: allergies, current medications, family history, past medical history, social history, past surgical history and problem list.  Review of Systems:  Review of Systems  Constitutional: Negative.   HENT: Negative.   Respiratory: Negative.   Cardiovascular: Negative.   Genitourinary: Negative.   Musculoskeletal: Negative.   Skin: Negative.   Neurological: Negative.   Psychiatric/Behavioral: Negative.       Objective:  Physical Exam BP 115/78   Pulse 85   Wt 149 lb (67.6 kg)   BMI 29.10 kg/m  Physical Exam Constitutional:      Appearance: Normal appearance.  Musculoskeletal:        General: Normal range of motion.  Skin:    General: Skin is warm and dry.  Neurological:     General: No focal deficit present.     Mental Status: She is alert.  Psychiatric:        Mood and Affect: Mood normal.        Behavior: Behavior normal.       Labs and Imaging No results found for this or any previous visit (from the past 24 hour(s)).  No results found.   Assessment & Plan:  1. Miscarriage vs. Pregnancy of unknown anatomic  location -records reviewed from [facility] visit in [place], patient had suspected retained products of conception, debris in endometrium, no masses on adnexa. However, IUP was not visualzied. She was advised to have follow up bchg at that time, but patient was out of town.    -unable to review records from visit to [facility], so cannot tell what US findings were was or beta HCG levels. Since patient is stable today, will draw CMP and bHCG. Patient understands that she will need weekly blood draws and that if there is concern for ectopic, she will require methotrexate or possible surgery. Advised patient to come to nearest emergency room if any pain or bleeding that worsens,.  Advised patient to avoid intercourse until bHCG levels drop to 5 or less, and that weekly blood draws might be necessary for several weeks to determine that. Patient and husband verbalized understanding.  - Comp Met (CMET) - Beta hCG quant (ref lab)    Approximately 20 minutes of total time was spent with this patient on counseling and coordination of care.   [place]arr [place], [place] 12/09/2020 5:36 PM

## 2020-12-10 LAB — COMPREHENSIVE METABOLIC PANEL
ALT: 14 IU/L (ref 0–32)
AST: 17 IU/L (ref 0–40)
Albumin/Globulin Ratio: 1.8 (ref 1.2–2.2)
Albumin: 4.2 g/dL (ref 3.8–4.8)
Alkaline Phosphatase: 56 IU/L (ref 44–121)
BUN/Creatinine Ratio: 15 (ref 9–23)
BUN: 11 mg/dL (ref 6–20)
Bilirubin Total: <0.2 mg/dL (ref 0.0–1.2)
CO2: 22 mmol/L (ref 20–29)
Calcium: 9.5 mg/dL (ref 8.7–10.2)
Chloride: 102 mmol/L (ref 96–106)
Creatinine, Ser: 0.72 mg/dL (ref 0.57–1.00)
Globulin, Total: 2.4 g/dL (ref 1.5–4.5)
Glucose: 89 mg/dL (ref 65–99)
Potassium: 4.3 mmol/L (ref 3.5–5.2)
Sodium: 138 mmol/L (ref 134–144)
Total Protein: 6.6 g/dL (ref 6.0–8.5)
eGFR: 112 mL/min/{1.73_m2} (ref >59)

## 2020-12-10 LAB — BETA HCG QUANT (REF LAB): hCG Quant: 111 m[IU]/mL

## 2020-12-16 ENCOUNTER — Other Ambulatory Visit: Payer: Self-pay

## 2020-12-16 ENCOUNTER — Other Ambulatory Visit: Payer: BC Managed Care – PPO

## 2020-12-16 DIAGNOSIS — O039 Complete or unspecified spontaneous abortion without complication: Secondary | ICD-10-CM

## 2020-12-17 LAB — BETA HCG QUANT (REF LAB): hCG Quant: 15 m[IU]/mL

## 2020-12-23 ENCOUNTER — Other Ambulatory Visit: Payer: Self-pay

## 2020-12-23 ENCOUNTER — Other Ambulatory Visit: Payer: BC Managed Care – PPO

## 2020-12-23 DIAGNOSIS — O039 Complete or unspecified spontaneous abortion without complication: Secondary | ICD-10-CM

## 2020-12-24 LAB — BETA HCG QUANT (REF LAB): hCG Quant: 5 m[IU]/mL

## 2020-12-28 ENCOUNTER — Telehealth: Payer: Self-pay | Admitting: *Deleted

## 2020-12-28 NOTE — Telephone Encounter (Signed)
Pt informed of results and follow up appointment made.

## 2020-12-28 NOTE — Telephone Encounter (Signed)
-----   Message from Osborne Oman, MD sent at 12/24/2020  9:22 AM EDT ----- Pregnancy resolved. She needs appointment to discuss contraception or future pregnancy plans.

## 2021-01-19 ENCOUNTER — Other Ambulatory Visit: Payer: Self-pay

## 2021-01-19 ENCOUNTER — Ambulatory Visit: Payer: BC Managed Care – PPO | Admitting: Obstetrics & Gynecology

## 2021-01-19 ENCOUNTER — Encounter: Payer: Self-pay | Admitting: Obstetrics & Gynecology

## 2021-01-19 VITALS — BP 125/79 | HR 83 | Ht 59.0 in | Wt 147.0 lb

## 2021-01-19 DIAGNOSIS — O039 Complete or unspecified spontaneous abortion without complication: Secondary | ICD-10-CM

## 2021-01-19 DIAGNOSIS — Z5189 Encounter for other specified aftercare: Secondary | ICD-10-CM

## 2021-01-19 NOTE — Progress Notes (Signed)
   GYNECOLOGY OFFICE VISIT NOTE  History:   Anne Long is a 35 y.o. G1P0010 here today for follow up after recent SAB.  Was treated for SAB with misoprostol at a hospital in [place], had HCG levels monitored here to ensure they declined appropriately given no access to ultrasound report.  HCG on 12/23/2020 was 5, which showed resolution of pregnancy.  Patient is here today to discuss future pregnancy plans, she is accompanied by her husband.   She desires to conceive again, is taking prenatal vitamins. Has had one normal menstrual period. She denies any abnormal vaginal discharge, bleeding, pelvic pain or other concerns.    History reviewed. No pertinent past medical history.  History reviewed. No pertinent surgical history.  The following portions of the patient's history were reviewed and updated as appropriate: allergies, current medications, past family history, past medical history, past social history, past surgical history and problem list.   Health Maintenance:  Normal pap and negative HRHPV on 11/07/2018.    Review of Systems:  Pertinent items noted in HPI and remainder of comprehensive ROS otherwise negative.  Physical Exam:  BP 125/79   Pulse 83   Ht 4\' 11"  (1.499 m)   Wt 147 lb (66.7 kg)   LMP 12/31/2020 (Exact Date)   BMI 29.69 kg/m  CONSTITUTIONAL: Well-developed, well-nourished female in no acute distress.  HEENT:  Normocephalic, atraumatic. External right and left ear normal. No scleral icterus.  NECK: Normal range of motion, supple, no masses noted on observation SKIN: No rash noted. Not diaphoretic. No erythema. No pallor. MUSCULOSKELETAL: Normal range of motion. No edema noted. NEUROLOGIC: Alert and oriented to person, place, and time. Normal muscle tone coordination. No cranial nerve deficit noted. PSYCHIATRIC: Normal mood and affect. Normal behavior. Normal judgment and thought content. CARDIOVASCULAR: Normal heart rate noted RESPIRATORY: Effort and breath sounds  normal, no problems with respiration noted ABDOMEN: No masses noted. No other overt distention noted.   PELVIC: Deferred     Assessment and Plan:    1. Follow-up visit after miscarriage Patient is doing well overall. Menstrual periods have resumed, this is wonderful evidence of resumption of ovulation.  Will continue taking prenatal vitamins with folic acid supplement, avoid teratogens, use ovulation tracker apps as needed. Optimization of overall health recommended.  She was told to let us know once she conceives again or let us know if she is having any problems or other gynecologic concerns.   Routine preventative health maintenance measures emphasized. Please refer to After Visit Summary for other counseling recommendations.   Return for any gynecologic concerns.    I spent 15 minutes dedicated to the care of this patient including pre-visit review of records, face to face time with the patient discussing her recent miscarriage and recommendations for future pregnancy    Verita Schneiders, MD, [place], Product/process development scientist for [place]

## 2021-01-19 NOTE — Patient Instructions (Signed)
Preparing for Pregnancy If you are planning to become pregnant, talk to your health care provider about preconception care. This type of care helps you prepare for a safe and healthy pregnancy. During this visit, your health care provider will:  Do a complete physical exam, including a Pap test.  Take your complete medical history.  Give you information, answer your questions, and help you resolve problems. Preconception checklist Medical history  Tell your health care provider about any medical conditions you have or have had. Your pregnancy or your ability to become pregnant may be affected by long-term (chronic) conditions, such as: ? Diabetes. ? High blood pressure (hypertension). ? Thyroid problems.  Tell your health care provider about your family's medical history and your partner's medical history.  Tell your health care provider if you have or have had any sexually transmitted infections, orSTIs. These can affect your pregnancy. In some cases, they can be passed to your baby.  If needed, discuss the benefits of genetic testing. This test checks for conditions that may be passed from parent to child.  Tell your health care provider about: ? Any problems you had getting pregnant or while pregnant. ? Any medicines you take. These include vitamins, herbal supplements, and over-the-counter medicines. ? Your history of getting vaccines. Discuss any vaccines that you may need. Diet  Ask your health care provider about what foods to eat in order to get a balance of nutrients. This is especially important when you are pregnant or preparing to become pregnant. It is recommended that women of childbearing age take a folic acid supplement of 400 mcg daily and eat foods rich in folic acid to prevent certain birth defects.  Ask your health care provider to help you reach a healthy weight before pregnancy. ? If you are overweight, you may have a higher risk for certain problems. These  include hypertension, diabetes, and early (preterm) birth. ? If you are underweight, you are more likely to have a baby who has a low birth weight. Lifestyle, work, and home Let your health care provider know about:  Any lifestyle habits that you have, such as use of alcohol, drugs, or tobacco products.  Fun and leisure activities that may put you at risk during pregnancy, such as downhill skiing and certain exercise programs.  Any plans to travel out of the country, especially to places with an active [place] virus outbreak.  Harmful substances that you may be exposed to at work or at home. These include chemicals, pesticides, radiation, and substances from cat litter boxes.  Any concerns you have for your safety at home. Mental health Tell your health care provider about:  Any history of mental health conditions, including feelings of depression, sadness, or anxiety.  Any medicines that you take for a mental health condition. These include herbs and supplements. How do I know that I am pregnant? You may be pregnant if you have been sexually active and you miss your period. Other symptoms of early pregnancy include:  Mild cramping.  Very light vaginal bleeding (spotting).  Feeling more tired than usual.  Nausea and vomiting. These may be signs of morning sickness. Take a home pregnancy test if you have any of these symptoms. This test checks for a hormone in your urine called human chorionic gonadotropin, or hCG. A woman's body begins to make this hormone during early pregnancy. These tests are very accurate. Wait until at least the first day after you miss your period to take a home pregnancy  home pregnancy test. If the test shows that you are pregnant, call your health care provider for a prenatal care visit. What should I do if I become pregnant?  Schedule a visit with your health care provider as soon as you suspect you are pregnant.  Talk to your health care provider if you are taking  prescription medicines to determine if they are safe to take during pregnancy.  You may continue to have sex if it does not cause pain or other problems, such as vaginal bleeding. Follow these instructions at home: Eating and drinking  Follow instructions from your health care provider about eating or drinking restrictions.  Drink enough fluid to keep your urine pale yellow.  Eat a balanced diet. This includes fresh fruits and vegetables, whole grains, lean meats, low-fat dairy products, healthy fats, and foods that are high in fiber. Ask to meet with a nutritionist or registered dietitian for help with meal planning and goals.  Avoid eating raw or undercooked meat and seafood.  Avoid eating or drinking unpasteurized dairy products.   Lifestyle  Get regular exercise. Try to be active for at least 30 minutes a day on most days of the week. Ask your health care provider which activities are safe during pregnancy.  Maintain a healthy weight.  Avoid toxic fumes and chemicals.  Avoid cleaning cat litter boxes. Cat feces may contain a harmful parasite called toxoplasma.  Avoid travel to countries where Zika virus is common.  Do not use any products that contain nicotine or tobacco, such as cigarettes, e-cigarettes, and chewing tobacco. If you need help quitting, ask your health care provider.  Do not drink alcohol or use drugs.      General instructions  Keep an accurate record of your menstrual periods. This makes it easier for your health care provider to determine your baby's due date.  Take over-the-counter and prescription medicines only as told by your health care provider.  Begin taking prenatal vitamins and folic acid supplements daily as directed.  Manage any chronic conditions, such as hypertension and diabetes, as told by your health care provider. This is important. Summary  If you are planning to become pregnant, talk to your health care provider about preconception  care. This is an important part of planning for a healthy pregnancy.  Women of childbearing age should take 400 mcg of folic acid daily in addition to eating a diet rich in folic acid. This will prevent certain birth defects.  Schedule a visit with your health care provider as soon as you suspect you are pregnant. Tell your health care provider about your medical history, lifestyle activities, home safety, and other things that may concern you. This information is not intended to replace advice given to you by your health care provider. Make sure you discuss any questions you have with your health care provider. Document Revised: 05/29/2019 Document Reviewed: 05/29/2019 Elsevier Patient Education  2021 Elsevier Inc.  

## 2021-03-18 ENCOUNTER — Other Ambulatory Visit: Payer: Self-pay | Admitting: *Deleted

## 2021-03-18 DIAGNOSIS — Z3201 Encounter for pregnancy test, result positive: Secondary | ICD-10-CM

## 2021-03-19 ENCOUNTER — Other Ambulatory Visit: Payer: BC Managed Care – PPO

## 2021-03-19 ENCOUNTER — Other Ambulatory Visit: Payer: Self-pay

## 2021-03-19 DIAGNOSIS — Z3201 Encounter for pregnancy test, result positive: Secondary | ICD-10-CM

## 2021-03-20 LAB — BETA HCG QUANT (REF LAB): hCG Quant: 6953 m[IU]/mL

## 2021-03-23 ENCOUNTER — Other Ambulatory Visit: Payer: Self-pay

## 2021-03-23 DIAGNOSIS — O3680X0 Pregnancy with inconclusive fetal viability, not applicable or unspecified: Secondary | ICD-10-CM

## 2021-04-05 ENCOUNTER — Other Ambulatory Visit: Payer: Self-pay

## 2021-04-05 ENCOUNTER — Ambulatory Visit
Admission: RE | Admit: 2021-04-05 | Discharge: 2021-04-05 | Disposition: A | Discharge status: Home / Self Care | Payer: BC Managed Care – PPO | Source: Ambulatory Visit | Attending: Obstetrics & Gynecology | Admitting: Obstetrics & Gynecology

## 2021-04-05 DIAGNOSIS — O3680X0 Pregnancy with inconclusive fetal viability, not applicable or unspecified: Secondary | ICD-10-CM | POA: Diagnosis present

## 2021-04-07 ENCOUNTER — Telehealth: Payer: Self-pay

## 2021-04-07 NOTE — Telephone Encounter (Signed)
Pt given results and will be scheduled for new ob visit.

## 2021-04-27 ENCOUNTER — Other Ambulatory Visit (HOSPITAL_COMMUNITY)
Admission: RE | Admit: 2021-04-27 | Discharge: 2021-04-27 | Disposition: A | Discharge status: Home / Self Care | Payer: BC Managed Care – PPO | Source: Ambulatory Visit | Attending: Obstetrics and Gynecology | Admitting: Obstetrics and Gynecology

## 2021-04-27 ENCOUNTER — Other Ambulatory Visit: Payer: Self-pay

## 2021-04-27 ENCOUNTER — Other Ambulatory Visit: Payer: Self-pay | Admitting: Obstetrics and Gynecology

## 2021-04-27 ENCOUNTER — Ambulatory Visit (INDEPENDENT_AMBULATORY_CARE_PROVIDER_SITE_OTHER): Payer: BC Managed Care – PPO | Admitting: Obstetrics and Gynecology

## 2021-04-27 ENCOUNTER — Encounter: Payer: Self-pay | Admitting: Obstetrics and Gynecology

## 2021-04-27 VITALS — BP 107/68 | HR 71 | Wt 148.0 lb

## 2021-04-27 DIAGNOSIS — Z3480 Encounter for supervision of other normal pregnancy, unspecified trimester: Secondary | ICD-10-CM | POA: Insufficient documentation

## 2021-04-27 DIAGNOSIS — O09519 Supervision of elderly primigravida, unspecified trimester: Secondary | ICD-10-CM

## 2021-04-29 LAB — CBC/D/PLT+RPR+RH+ABO+RUBIGG...
Antibody Screen: NEGATIVE
Basophils Absolute: 0 10*3/uL (ref 0.0–0.2)
Basos: 0 %
EOS (ABSOLUTE): 0 10*3/uL (ref 0.0–0.4)
Eos: 0 %
HCV Ab: <0.1 s/co ratio (ref 0.0–0.9)
HIV Screen 4th Generation wRfx: NONREACTIVE
Hematocrit: 36.7 % (ref 34.0–46.6)
Hemoglobin: 12.5 g/dL (ref 11.1–15.9)
Hepatitis B Surface Ag: NEGATIVE
Immature Grans (Abs): 0 10*3/uL (ref 0.0–0.1)
Immature Granulocytes: 0 %
Lymphocytes Absolute: 2.3 10*3/uL (ref 0.7–3.1)
Lymphs: 31 %
MCH: 26.2 pg — ABNORMAL LOW (ref 26.6–33.0)
MCHC: 34.1 g/dL (ref 31.5–35.7)
MCV: 77 fL — ABNORMAL LOW (ref 79–97)
Monocytes Absolute: 0.7 10*3/uL (ref 0.1–0.9)
Monocytes: 9 %
Neutrophils Absolute: 4.5 10*3/uL (ref 1.4–7.0)
Neutrophils: 60 %
Platelets: 286 10*3/uL (ref 150–450)
RBC: 4.77 x10E6/uL (ref 3.77–5.28)
RDW: 16.5 % — ABNORMAL HIGH (ref 11.7–15.4)
RPR Ser Ql: NONREACTIVE
Rh Factor: POSITIVE
Rubella Antibodies, IGG: 2.08 index (ref >0.99)
WBC: 7.5 10*3/uL (ref 3.4–10.8)

## 2021-04-29 LAB — COMPREHENSIVE METABOLIC PANEL
ALT: 10 IU/L (ref 0–32)
AST: 12 IU/L (ref 0–40)
Albumin/Globulin Ratio: 1.6 (ref 1.2–2.2)
Albumin: 4.2 g/dL (ref 3.8–4.8)
Alkaline Phosphatase: 56 IU/L (ref 44–121)
BUN/Creatinine Ratio: 10 (ref 9–23)
BUN: 7 mg/dL (ref 6–20)
Bilirubin Total: 0.3 mg/dL (ref 0.0–1.2)
CO2: 23 mmol/L (ref 20–29)
Calcium: 9.7 mg/dL (ref 8.7–10.2)
Chloride: 96 mmol/L (ref 96–106)
Creatinine, Ser: 0.68 mg/dL (ref 0.57–1.00)
Globulin, Total: 2.7 g/dL (ref 1.5–4.5)
Glucose: 81 mg/dL (ref 65–99)
Potassium: 4.5 mmol/L (ref 3.5–5.2)
Sodium: 134 mmol/L (ref 134–144)
Total Protein: 6.9 g/dL (ref 6.0–8.5)
eGFR: 117 mL/min/{1.73_m2} (ref >59)

## 2021-04-29 LAB — HEMOGLOBIN A1C
Est. average glucose Bld gHb Est-mCnc: 120 mg/dL
Hgb A1c MFr Bld: 5.8 % — ABNORMAL HIGH (ref 4.8–5.6)

## 2021-04-29 LAB — GC/CHLAMYDIA PROBE AMP (~~LOC~~) NOT AT ARMC
Chlamydia: NEGATIVE
Comment: NEGATIVE
Comment: NORMAL
Neisseria Gonorrhea: NEGATIVE

## 2021-04-29 LAB — PROTEIN / CREATININE RATIO, URINE
Creatinine, Urine: 203.1 mg/dL
Protein, Ur: 13.7 mg/dL
Protein/Creat Ratio: 67 mg/g creat (ref 0–200)

## 2021-04-29 LAB — CULTURE, OB URINE

## 2021-04-29 LAB — URINE CULTURE, OB REFLEX

## 2021-04-29 LAB — TSH: TSH: 0.792 u[IU]/mL (ref 0.450–4.500)

## 2021-04-29 LAB — HCV INTERPRETATION

## 2021-05-03 ENCOUNTER — Encounter: Payer: Self-pay | Admitting: Obstetrics and Gynecology

## 2021-05-03 DIAGNOSIS — O99810 Abnormal glucose complicating pregnancy: Secondary | ICD-10-CM | POA: Insufficient documentation

## 2021-05-03 DIAGNOSIS — Z9889 Other specified postprocedural states: Secondary | ICD-10-CM

## 2021-05-03 DIAGNOSIS — O3440 Maternal care for other abnormalities of cervix, unspecified trimester: Secondary | ICD-10-CM | POA: Insufficient documentation

## 2021-05-03 DIAGNOSIS — O0990 Supervision of high risk pregnancy, unspecified, unspecified trimester: Secondary | ICD-10-CM | POA: Insufficient documentation

## 2021-05-03 HISTORY — DX: Maternal care for other abnormalities of cervix, unspecified trimester: O34.40

## 2021-05-03 HISTORY — DX: Maternal care for other abnormalities of cervix, unspecified trimester: Z98.890

## 2021-05-04 ENCOUNTER — Telehealth: Payer: Self-pay | Admitting: *Deleted

## 2021-05-04 NOTE — Telephone Encounter (Signed)
Pt informed of HA1c result and the need for an early 2hr gtt. Pt will check her schedule and call us back to make the appointment.

## 2021-05-04 NOTE — Telephone Encounter (Signed)
-----   Message from Aletha Halim, MD sent at 05/03/2021 12:21 PM EDT ----- Can you let her know that her a1c showed potential pre-diabetes so she needs an early 2h GTT and make her a lab only visit for this sometime in the next week or two? thanks

## 2021-05-04 NOTE — Progress Notes (Signed)
New OB Note  04/27/2021   Clinic: [facility]  Chief Complaint: NOB  Transfer of Care Patient: no  History of Present Illness: Anne Long is a 35 y.o. G2P0010 @ 10/2 weeks ([place] 3/12, based on Patient's last menstrual period was [id number] u/s Preg complicated by has Advanced maternal age, primigravida, antepartum; Supervision of high risk pregnancy, antepartum; and History of cryosurgery of cervix on their problem list.   Any events prior to today's visit: no Her periods were: qmonth, regular She was using no method when she conceived.  She has mild signs or symptoms of nausea/vomiting of pregnancy. She has Negative signs or symptoms of miscarriage or preterm labor  ROS: A 12-point review of systems was performed and negative, except as stated in the above HPI.  OBGYN History: As per HPI. OB History  Gravida Para Term Preterm AB Living  2 0 0 0 1 0  SAB IAB Ectopic Multiple Live Births  1 0 0 0 0    # Outcome Date GA Lbr Len/2nd Weight Sex Delivery Anes PTL Lv  2 Current           1 SAB 12/04/20 [redacted]w[redacted]d           Birth Comments: Recent COVID infection    Any issues with any prior pregnancies: not applicable Prior children are healthy, doing well, and without any problems or issues: not applicable History of pap smears: Yes. 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   Seen on colposcopy after ASCUS +HPV pap smear '[ ]'$  Cryotherapy   Vaginal Pap smear, abnormal     Past Surgical History: Past Surgical History:  Procedure Laterality Date   GYNECOLOGIC CRYOSURGERY      Family History:  Family History  Problem Relation Age of Onset   Ovarian cancer Maternal Grandmother [phone]  She denies any history of mental retardation, birth defects or genetic disorders in her or the FOB's history  Social History:   Social History   Socioeconomic History   Marital status: Married    Spouse name: Not on file   Number of children: Not on file   Years of education: Not on file   Highest education level: Not on file  Occupational History   Occupation: TProduct manager [employer]  Tobacco Use   Smoking status: Never   Smokeless tobacco: Never  Substance and Sexual Activity   Alcohol use: No   Drug use: No   Sexual activity: Yes    Birth control/protection: None  Other Topics Concern   Not on file  Social History Narrative   Not on file   Social Determinants of Health   Financial Resource Strain: Not on file  Food Insecurity: Not on file  Transportation Needs: Not on file  Physical Activity: Not on file  Stress: Not on file  Social Connections: Not on file  Intimate Partner Violence: Not on file    Allergy: Allergies  Allergen Reactions   Other Swelling    Shrimp    Health Maintenance:  Mammogram Up to Date: not applicable  Current Outpatient Medications: Prenatal vitamin  Physical Exam:   BP 107/68   Pulse 71   Wt 148 lb (67.1 kg)   LMP 02/14/2021   BMI 29.89 kg/m  Body mass index is 29.89 kg/m. Contractions: Not  present Vag. Bleeding: None. Fundal height: not applicable FHTs: [id number]  General appearance: Well nourished, well developed female in no acute distress.  Neck:  Supple, normal appearance, and no thyromegaly  Cardiovascular: S1, S2 normal, no murmur, rub or gallop, regular rate and rhythm Respiratory:  Clear to auscultation bilateral. Normal respiratory effort Abdomen: positive bowel sounds and no masses, hernias; diffusely non tender to palpation, non distended Breasts: breasts appear normal, no suspicious masses, no skin or nipple changes or axillary nodes, and normal palpation. Neuro/Psych:  Normal mood and affect.  Skin:  Warm and dry.  Lymphatic:  No inguinal lymphadenopathy.   Pelvic exam: is not limited by body habitus EGBUS:  within normal limits, Vagina: within normal limits and with no blood in the vault, Cervix: normal appearing cervix without discharge or lesions, closed/long/high, Uterus:  enlarged, c/w 10-12 week size, and Adnexa:  normal adnexa and no mass, fullness, tenderness  Laboratory: none  Imaging:  none  Assessment: pt doing well  Plan: 1. Supervision of other normal pregnancy, antepartum - CBC/D/Plt+RPR+Rh+ABO+RubIgG... - Culture, OB Urine - GC/Chlamydia probe amp ([place])not at [facility] - TSH - Protein / creatinine ratio, urine - Comprehensive metabolic panel - Hemoglobin A1c - Enroll Patient in PreNatal Babyscripts - Genetic Screening - US MFM OB COMP + 14 WK; Future  2. Advanced maternal age, primigravida, antepartum F/u genetics. Offer afp at 15 weeks  Problem list reviewed and updated.  Follow up in 4 weeks.  >50% of 30 min visit spent on counseling and coordination of care.     Durene Romans MD Attending Center for [facility])

## 2021-05-06 ENCOUNTER — Other Ambulatory Visit: Payer: Self-pay

## 2021-05-06 ENCOUNTER — Telehealth: Payer: Self-pay | Admitting: Radiology

## 2021-05-06 ENCOUNTER — Encounter: Payer: Self-pay | Admitting: Radiology

## 2021-05-06 ENCOUNTER — Other Ambulatory Visit: Payer: BC Managed Care – PPO

## 2021-05-06 DIAGNOSIS — Z3480 Encounter for supervision of other normal pregnancy, unspecified trimester: Secondary | ICD-10-CM

## 2021-05-06 NOTE — Telephone Encounter (Signed)
Patient was informed of Panorama results

## 2021-05-07 LAB — GLUCOSE TOLERANCE, 2 HOURS W/ 1HR
Glucose, 1 hour: 136 mg/dL (ref 65–179)
Glucose, 2 hour: 111 mg/dL (ref 65–152)
Glucose, Fasting: 75 mg/dL (ref 65–91)

## 2021-05-13 ENCOUNTER — Telehealth: Payer: Self-pay | Admitting: *Deleted

## 2021-05-13 ENCOUNTER — Encounter: Payer: Self-pay | Admitting: *Deleted

## 2021-05-13 NOTE — Telephone Encounter (Signed)
Called pt to give her Horizon results. Pt will discuss with partner and if they decide to have him tested we can give him the kit on next ROB visit.

## 2021-05-18 ENCOUNTER — Encounter: Payer: Self-pay | Admitting: Obstetrics and Gynecology

## 2021-05-18 DIAGNOSIS — D563 Thalassemia minor: Secondary | ICD-10-CM | POA: Insufficient documentation

## 2021-05-18 HISTORY — DX: Thalassemia minor: D56.3

## 2021-05-25 ENCOUNTER — Encounter: Payer: Self-pay | Admitting: Obstetrics & Gynecology

## 2021-05-25 ENCOUNTER — Ambulatory Visit (INDEPENDENT_AMBULATORY_CARE_PROVIDER_SITE_OTHER): Payer: BC Managed Care – PPO | Admitting: Obstetrics & Gynecology

## 2021-05-25 ENCOUNTER — Other Ambulatory Visit: Payer: Self-pay

## 2021-05-25 VITALS — BP 109/70 | HR 88 | Wt 149.2 lb

## 2021-05-25 DIAGNOSIS — Z3A14 14 weeks gestation of pregnancy: Secondary | ICD-10-CM

## 2021-05-25 DIAGNOSIS — O0990 Supervision of high risk pregnancy, unspecified, unspecified trimester: Secondary | ICD-10-CM

## 2021-05-25 DIAGNOSIS — O09519 Supervision of elderly primigravida, unspecified trimester: Secondary | ICD-10-CM

## 2021-05-25 DIAGNOSIS — O99810 Abnormal glucose complicating pregnancy: Secondary | ICD-10-CM

## 2021-05-25 DIAGNOSIS — D563 Thalassemia minor: Secondary | ICD-10-CM

## 2021-05-25 NOTE — Progress Notes (Signed)
   PRENATAL VISIT NOTE  Subjective:  Anne Long is a 35 y.o. G2P0010 at 80w2dbeing seen today for ongoing prenatal care.  She is currently monitored for the following issues for this high-risk pregnancy and has Advanced maternal age, primigravida, antepartum; Abnormal glucose affecting pregnancy; Supervision of high risk pregnancy, antepartum; History of cryosurgery of cervix; and Alpha thalassemia silent carrier on their problem list.  Patient reports no complaints.  Contractions: Not present. Vag. Bleeding: None.   . Denies leaking of fluid.   The following portions of the patient's history were reviewed and updated as appropriate: allergies, current medications, past family history, past medical history, past social history, past surgical history and problem list.   Objective:   Vitals:   05/25/21 1355  BP: 109/70  Pulse: 88  Weight: 149 lb 3.2 oz (67.7 kg)    Fetal Status: Fetal Heart Rate (bpm): 152         General:  Alert, oriented and cooperative. Patient is in no acute distress.  Skin: Skin is warm and dry. No rash noted.   Cardiovascular: Normal heart rate noted  Respiratory: Normal respiratory effort, no problems with respiration noted  Abdomen: Soft, gravid, appropriate for gestational age.  Pain/Pressure: Present     Pelvic: Cervical exam deferred        Extremities: Normal range of motion.     Mental Status: Normal mood and affect. Normal behavior. Normal judgment and thought content.   Assessment and Plan:  Pregnancy: G2P0010 at 131w2d. Alpha thalassemia silent carrier FOB is considering getting testing  2. Abnormal glucose affecting pregnancy Normal 2 hr GTT, will repeat at 28 weeks.  3. Advanced maternal age, primigravida, antepartum Declines NIPS or other genetic testing.  4. [redacted] weeks gestation of pregnancy 5. Supervision of high risk pregnancy, antepartum No other complaints or concerns.  Routine obstetric precautions reviewed.  Please refer to  After Visit Summary for other counseling recommendations.   Return in about 4 weeks (around 06/22/2021) for OFFICE OB VISIT (MD or APP).  Future Appointments  Date Time Provider [place]10/13/2022  2:00 PM [facility] US1 [facility]  06/24/2021  3:00 PM [facility] CONSULT RM [facility] None    UgVerita SchneidersMD

## 2021-05-25 NOTE — Patient Instructions (Signed)

## 2021-05-26 ENCOUNTER — Encounter: Payer: BC Managed Care – PPO | Admitting: Advanced Practice Midwife

## 2021-06-11 ENCOUNTER — Other Ambulatory Visit: Payer: Self-pay | Admitting: Obstetrics and Gynecology

## 2021-06-11 DIAGNOSIS — D563 Thalassemia minor: Secondary | ICD-10-CM

## 2021-06-11 DIAGNOSIS — O09522 Supervision of elderly multigravida, second trimester: Secondary | ICD-10-CM

## 2021-06-23 ENCOUNTER — Ambulatory Visit (INDEPENDENT_AMBULATORY_CARE_PROVIDER_SITE_OTHER): Payer: BC Managed Care – PPO | Admitting: Advanced Practice Midwife

## 2021-06-23 ENCOUNTER — Other Ambulatory Visit: Payer: Self-pay

## 2021-06-23 VITALS — BP 109/69 | HR 86 | Wt 147.0 lb

## 2021-06-23 DIAGNOSIS — N9089 Other specified noninflammatory disorders of vulva and perineum: Secondary | ICD-10-CM

## 2021-06-23 DIAGNOSIS — O0990 Supervision of high risk pregnancy, unspecified, unspecified trimester: Secondary | ICD-10-CM

## 2021-06-23 DIAGNOSIS — K219 Gastro-esophageal reflux disease without esophagitis: Secondary | ICD-10-CM

## 2021-06-23 DIAGNOSIS — Z3A18 18 weeks gestation of pregnancy: Secondary | ICD-10-CM

## 2021-06-23 NOTE — Progress Notes (Signed)
   PRENATAL VISIT NOTE  Subjective:  Anne Long is a 35 y.o. G2P0010 at [redacted]w[redacted]d being seen today for ongoing prenatal care.  She is currently monitored for the following issues for this high-risk pregnancy and has Advanced maternal age, primigravida, antepartum; Abnormal glucose affecting pregnancy; Supervision of high risk pregnancy, antepartum; History of cryosurgery of cervix; and Alpha thalassemia silent carrier on their problem list.  Patient reports  seeing a "tiny spot" of blood this morning . This is a new problem. She believes she may have an external lesion that is the source of the blood. She denies additional episodes of vaginal bleeding. She denies dysuria, abdominal tenderness, DFM. She is remote from sexual intercourse.   Patient also endorses mild epigastric discomfort which she believes is GERD. She is managing with Tums and obtains the desired level of relief.   Contractions: Not present. Vag. Bleeding: Other.  Movement: Present. Denies leaking of fluid.   The following portions of the patient's history were reviewed and updated as appropriate: allergies, current medications, past family history, past medical history, past social history, past surgical history and problem list. Problem list updated.  Objective:   Vitals:   06/23/21 0831  BP: 109/69  Pulse: 86  Weight: 147 lb (66.7 kg)    Fetal Status: Fetal Heart Rate (bpm): 147   Movement: Present     General:  Alert, oriented and cooperative. Patient is in no acute distress.  Skin: Skin is warm and dry. No rash noted.   Cardiovascular: Normal heart rate noted  Respiratory: Normal respiratory effort, no problems with respiration noted  Abdomen: Soft, gravid, appropriate for gestational age.  Pain/Pressure: Present     Pelvic: Cervical exam deferred        Extremities: Normal range of motion.  Edema: None  Mental Status: Normal mood and affect. Normal behavior. Normal judgment and thought content.   Assessment  and Plan:  Pregnancy: G2P0010 at [redacted]w[redacted]d  1. Supervision of high risk pregnancy, antepartum - Routine care - Continue taking PNV at night to minimize nausea. Consider protein-based snack just before taking PNV - Anatomy scan tomorrow - AFP, Serum, Open Spina Bifida  2. [redacted] weeks gestation of pregnancy   3. Labial lesion - Visible broken skin within pubic hair, tiny spot of newly formed scab, likely source of blood - Speculum exam, swab collection declined by patient  4. Gastroesophageal reflux disease without esophagitis - Continue TUMS PRN - Consider Prilosec PRN for proactive management of symptoms  Preterm labor symptoms and general obstetric precautions including but not limited to vaginal bleeding, contractions, leaking of fluid and fetal movement were reviewed in detail with the patient. Please refer to After Visit Summary for other counseling recommendations.  Return in about 4 weeks (around 07/21/2021) for MD.  Future Appointments  Date Time Provider [place]  06/24/2021  2:00 PM [facility] [place]  06/24/2021  3:00 PM [facility] CONSULT RM [facility] None  07/20/2021  9:05 AM Anyanwu, Sallyanne Havers, MD [place], CNM

## 2021-06-23 NOTE — Patient Instructions (Signed)

## 2021-06-23 NOTE — Progress Notes (Signed)
ROB [redacted]w[redacted]d  AFP Today.  Flu Vaccine Offered: Pt has already received at work last month.  CC: pt notices small amount of blood this morning unsure if it may be from a cut last had intercourse last week would like exam.

## 2021-06-24 ENCOUNTER — Other Ambulatory Visit: Payer: Self-pay

## 2021-06-24 ENCOUNTER — Ambulatory Visit: Payer: BC Managed Care – PPO

## 2021-06-24 ENCOUNTER — Ambulatory Visit: Payer: BC Managed Care – PPO | Admitting: Obstetrics and Gynecology

## 2021-06-24 ENCOUNTER — Ambulatory Visit: Payer: BC Managed Care – PPO | Attending: Obstetrics and Gynecology

## 2021-06-24 DIAGNOSIS — Z3A00 Weeks of gestation of pregnancy not specified: Secondary | ICD-10-CM | POA: Diagnosis not present

## 2021-06-24 DIAGNOSIS — O352XX0 Maternal care for (suspected) hereditary disease in fetus, not applicable or unspecified: Secondary | ICD-10-CM | POA: Insufficient documentation

## 2021-06-24 DIAGNOSIS — Z3689 Encounter for other specified antenatal screening: Secondary | ICD-10-CM

## 2021-06-24 DIAGNOSIS — O09519 Supervision of elderly primigravida, unspecified trimester: Secondary | ICD-10-CM | POA: Diagnosis not present

## 2021-06-24 DIAGNOSIS — O09522 Supervision of elderly multigravida, second trimester: Secondary | ICD-10-CM

## 2021-06-24 DIAGNOSIS — Z3A18 18 weeks gestation of pregnancy: Secondary | ICD-10-CM

## 2021-06-24 DIAGNOSIS — D563 Thalassemia minor: Secondary | ICD-10-CM | POA: Insufficient documentation

## 2021-06-24 DIAGNOSIS — O3412 Maternal care for benign tumor of corpus uteri, second trimester: Secondary | ICD-10-CM | POA: Diagnosis not present

## 2021-06-24 DIAGNOSIS — D259 Leiomyoma of uterus, unspecified: Secondary | ICD-10-CM

## 2021-06-24 LAB — AFP, SERUM, OPEN SPINA BIFIDA
AFP MoM: 1.71
AFP Value: 90.4 ng/mL
Gest. Age on Collection Date: 18.3 weeks
Maternal Age At EDD: 35.1 yr
OSBR Risk 1 IN: 3181
Test Results:: NEGATIVE
Weight: 147 [lb_av]

## 2021-06-24 NOTE — Progress Notes (Addendum)
Referring provider: Dr. Ilda Basset, [facility], [facility] Length of consultation: 30 minutes  Anne Long was referred to [facility] for genetic counseling to discuss the results of recent carrier screening and possible effects on this pregnancy.  The patient was present at the visit with her husband, Anne Long (dob 10/18/1980).   To review, Anne Long had [employer] carrier screening performed through Anne Long which was ordered through her OB. 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   Alpha-thalassemia is different in its inheritance compared to other hemoglobinopathies as there are two copies of two alpha globin genes (HBA1 and HBA2) on each chromosome 16, or four alpha globin genes total (aa/aa). A person can be a carrier of one alpha gene mutation (aa/a-), also referred to as a "silent carrier". A person who carries two alpha globin gene mutations can either carry them in cis (both on the same chromosome, denoted as aa/--) or in trans (on different chromosomes, denoted as a-/a-). Alpha-thalassemia carriers of two mutations who have African American ancestry are more likely to have a trans arrangement (a-/a-); cis configuration is reported to be rare in individuals with African American ancestry.     There are several different forms of alpha-thalassemia. The most severe form of alpha-thalassemia, Hb Barts, is associated with an absence of alpha globin chain synthesis as a result of deletions of all four alpha globin genes (--/--).  Given that Anne Long is a silent carrier (aa/a-), her pregnancies would not be at increased risk for Hb Barts, even if her partner is a carrier for alpha-thalassemia, as she will always pass on at least one copy of the alpha globin gene to her children. Hemoglobin H (HbH) disease is caused by three deleted or  dysfunctioning alpha globin alleles (a-/--) and is characterized by microcytic hypochromic hemolytic anemia, hepatosplenomegaly, mild jaundice, growth delays, and sometimes thalassemia-like bone changes. Given Anne Long's silent carrier status (aa/a-), the current fetus would only be at risk for HbH disease (a-/--), if her partner is a carrier for two alpha globin mutations in cis (aa/--). If this is the case, the risk for HbH disease in the pregnancy would be 1 in 4 (25%). However, if Anne Long's partner is a carrier for two alpha globin mutations, he would be more likely to carry them in trans configuration (a-/a-) than the cis configuration (aa/--), given his ethnicity. If he is a carrier of alpha-thalassemia in trans, then the pregnancy would not be at increased risk for HbH disease and would at most be a carrier of two gene changes in trans (a-/a-).  Carriers may have mild anemia, or small red blood cells (low MCV on CBC), but are expected to be healthy. Based on the carrier frequency for alpha-thalassemia in the African American population, Anne Long partner has a 1 in 30 chance of being any type of carrier for alpha-thalassemia.   The patient also had carrier screening for changes in the beta globin gene, cystic fibrosis and spinal muscular atrophy.  These results were normal which dramatically reduces the chance for her to be a carrier, but cannot completely eliminate the chance.  She also had Pan[employer] screening for chromosome conditions earlier in the pregnancy which was low risk for Down syndrome, trisomy 42, trisomy 49, monosomy X and triploidy.  See that result for additional details. Gender was not ordered on that sample per  the patient's request.  The couple received an envelope today with the fetal gender and asked that I call a friend, Anne Long, today to reveal gender.  I spoke with her and gave the information as requested.  We also obtained a detailed family history and pregnancy history.   This is the second pregnancy for this couple.  They had one first trimester miscarriage prior to this pregnancy. The patient reported no complications in this pregnancy or exposure to alcohol, tobacco, recreational drugs or prescription medications.  Anne Long has a 37 year old daughter from a prior relationship who is in good health.  The patient reported a maternal first cousin who is 48 years old with deafness and autism.  We reviewed that there may be many reasons for autism spectrum disorders as well as hearing impairment.  Some of these causes may have strong genetic factors while others may be due to environmental or other causes.  We would need additional information to determine the recurrence risks for other family members.  We did talk about the option of Fragile X carrier screening, which the patient declined.  Anne Long reported a maternal first cousin (uncle's daughter) with breast cancer at a young age, however, her mother passed away from the condition and she is unrelated to this family. No other persons in Kenneth's family were reported to have cancer at young ages or any history of breast, ovarian or colon cancers. The remainder of the family history is unremarkable for birth defects, intellectual disabilities, recurrent pregnancy loss or known genetic conditions.  Plan of care: Anne Long elected to proceed with carrier testing for HBA.  He obtained a sample collection kit from her OB and will send it in in the next couple of days. They have my contact information to reach out with questions once those results become available. The patient declined amniocentesis which was offered due to maternal age (the patient will be 35 years old at the time of delivery).  Given her normal NIPS and ultrasound, she was comfortable with that information.  We appreciate being involved in the care of this family and can be reached at [phone].   Anne Finlay, MS, CGC

## 2021-06-25 ENCOUNTER — Encounter: Payer: Self-pay | Admitting: Obstetrics and Gynecology

## 2021-06-25 DIAGNOSIS — D259 Leiomyoma of uterus, unspecified: Secondary | ICD-10-CM | POA: Insufficient documentation

## 2021-06-25 DIAGNOSIS — O3410 Maternal care for benign tumor of corpus uteri, unspecified trimester: Secondary | ICD-10-CM | POA: Insufficient documentation

## 2021-07-20 ENCOUNTER — Ambulatory Visit (INDEPENDENT_AMBULATORY_CARE_PROVIDER_SITE_OTHER): Payer: BC Managed Care – PPO | Admitting: Obstetrics & Gynecology

## 2021-07-20 ENCOUNTER — Other Ambulatory Visit: Payer: Self-pay

## 2021-07-20 VITALS — BP 117/73 | HR 96 | Wt 148.0 lb

## 2021-07-20 DIAGNOSIS — O0992 Supervision of high risk pregnancy, unspecified, second trimester: Secondary | ICD-10-CM

## 2021-07-20 DIAGNOSIS — O0990 Supervision of high risk pregnancy, unspecified, unspecified trimester: Secondary | ICD-10-CM

## 2021-07-20 DIAGNOSIS — D563 Thalassemia minor: Secondary | ICD-10-CM

## 2021-07-20 DIAGNOSIS — Z3A22 22 weeks gestation of pregnancy: Secondary | ICD-10-CM

## 2021-07-20 NOTE — Progress Notes (Signed)
PRENATAL VISIT NOTE  Subjective:  Anne Long is a 35 y.o. G2P0010 at 16w2dbeing seen today for ongoing prenatal care.  She is currently monitored for the following issues for this high-risk pregnancy and has Advanced maternal age, primigravida, antepartum; Abnormal glucose affecting pregnancy; Supervision of high risk pregnancy, antepartum; History of cryosurgery of cervix; Alpha thalassemia silent carrier; and Uterine fibroid complicating antenatal care, baby not yet delivered on their problem list.  Patient reports no complaints.  Contractions: Not present. Vag. Bleeding: None.  Movement: Present. Denies leaking of fluid.   The following portions of the patient's history were reviewed and updated as appropriate: allergies, current medications, past family history, past medical history, past social history, past surgical history and problem list.   Objective:   Vitals:   07/20/21 0913  BP: 117/73  Pulse: 96  Weight: 148 lb (67.1 kg)    Fetal Status: Fetal Heart Rate (bpm): 165   Movement: Present     General:  Alert, oriented and cooperative. Patient is in no acute distress.  Skin: Skin is warm and dry. No rash noted.   Cardiovascular: Normal heart rate noted  Respiratory: Normal respiratory effort, no problems with respiration noted  Abdomen: Soft, gravid, appropriate for gestational age.  Pain/Pressure: Present     Pelvic: Cervical exam deferred        Extremities: Normal range of motion.  Edema: None  Mental Status: Normal mood and affect. Normal behavior. Normal judgment and thought content.   [place]MFM OB DETAIL +14 WK  Result Date: 06/24/2021 ----------------------------------------------------------------------  OBSTETRICS REPORT                       (Signed Final 06/24/2021 03:52 pm) ---------------------------------------------------------------------- Patient Info  ID #:       [id number]                         D.O.B.:  [id number](35 yrs)  Name:       Anne Long                 Visit Date: 06/24/2021 01:48 pm ---------------------------------------------------------------------- Performed By  Attending:        RTama HighMD        Ref. Address:     [address]                                                            [place] Performed By:     TElpidio Eric  [facility]                    RDMS, RDCS                               [facility]                                                             [facility]  Referred By:      Aletha Halim MD ---------------------------------------------------------------------- Orders  #  Description                           Code        Ordered By  1  [place] MFM OB DETAIL +14 [place]               76811.01    CHARLIE PICKENS ----------------------------------------------------------------------  #  Order #                     Accession #                Episode #  1  [id number]                   [id number]                 [id number] ---------------------------------------------------------------------- Indications  Advanced maternal age multigravida 75+,        O108.522  second trimester  Alpha thalassemia silent carrier  Uterine fibroids affecting pregnancy in        O34.12, D25.9  second trimester, antepartum  [redacted] weeks gestation of pregnancy                Z3A.18  Encounter for antenatal screening for          Z36.3  malformations  Low Risk NIPS ---------------------------------------------------------------------- Fetal Evaluation  Num Of Fetuses:         1  Fetal Heart Rate(bpm):  144  Cardiac Activity:       Observed  Presentation:           Breech  Placenta:               Anterior  P. Cord Insertion:      Visualized, central  Amniotic Fluid  AFI FV:      Within normal limits                              Largest Pocket(cm)                               5.1 ---------------------------------------------------------------------- Biometry  BPD:      43.9  mm     G. Age:  19w 2d         79  %    CI:        70.27   %  70 - 86                                                          FL/HC:      16.9   %    16.1 - 18.3  HC:       167   mm     G. Age:  19w 3d         80  %    HC/AC:      1.23        1.09 - 1.39  AC:      135.4  mm     G. Age:  19w 0d         61  %    FL/BPD:     64.2   %  FL:       28.2  mm     G. Age:  18w 4d         46  %    FL/AC:      20.8   %    20 - 24  HUM:        27  mm     G. Age:  18w 4d         53  %  CER:      19.8  mm     G. Age:  19w 1d         72  %  NFT:       3.2  mm  LV:        4.6  mm  CM:        4.1  mm  Est. FW:     263  gm      0 lb 9 oz     66  % ---------------------------------------------------------------------- OB History  Gravidity:    2          SAB:   1  Living:       0 ---------------------------------------------------------------------- Gestational Age  LMP:           18w 4d        Date:  02/14/21                 EDD:   11/21/21  U/S Today:     19w 1d                                        EDD:   11/17/21  Best:          18w 4d     Det. By:  LMP  (02/14/21)          EDD:   11/21/21 ---------------------------------------------------------------------- Anatomy  Cranium:               Appears normal         Aortic Arch:            Appears normal  Cavum:                 Appears normal         Ductal Arch:            Appears normal  Ventricles:  Appears normal         Diaphragm:              Appears normal  Choroid Plexus:        Appears normal         Stomach:                Appears normal, left                                                                        sided  Cerebellum:            Appears normal         Abdomen:                Appears normal  Posterior Fossa:       Appears normal         Abdominal Wall:         Appears nml (cord                                                                         insert, abd wall)  Nuchal Fold:           Appears normal         Cord Vessels:           Appears normal (3                                                                        vessel cord)  Face:                  Appears normal         Kidneys:                Appear normal                         (orbits and profile)  Lips:                  Not well visualized    Bladder:                Appears normal  Heart:                 Appears normal         Spine:                  Appears normal                         (4CH, axis, and  situs)  RVOT:                  Not well visualized    Upper Extremities:      Appears normal  LVOT:                  Appears normal         Lower Extremities:      Appears normal  Other:  Parents do not wish to know sex of fetus, appears normal ---------------------------------------------------------------------- Targeted Anatomy  Thorax  SVC:                   Appears normal         3 V Trachea View:       Appears normal  3 Vessel View:         Not well visualized    IVC:                    Appears normal ---------------------------------------------------------------------- Cervix Uterus Adnexa  Cervix  Length:           3.67  cm.  Normal appearance by transabdominal scan.  Right Ovary  Within normal limits.  Left Ovary  Within normal limits. ---------------------------------------------------------------------- Myomas  Site                     L(cm)      W(cm)      D(cm)       Location  Anterior                 4.3        1.9        3  Anterior right           5          4.4        3.2  Posterior                3.6        3.6        2.6  Posterior left           3.3        2.9        3.5  Posterior left           3.4        3.1        3.1 ----------------------------------------------------------------------  Blood Flow                  RI       PI       Comments ---------------------------------------------------------------------- Impression  G2 P0.   Patient is here for fetal anatomy scan.  On cell-free fetal DNA screening, the risks of fetal  aneuploidies are not increased .  We performed fetal anatomy scan. No makers of  aneuploidies or fetal structural defects are seen. Fetal  biometry is consistent with her previously-established dates.  Amniotic fluid is normal and good fetal activity is seen.  Patient understands the limitations of ultrasound in detecting  fetal anomalies.  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  Patient  does not have symptoms pertaining to her myomas.  Patient is a silent carrier for alpha thalassemia (aa/a-) . The  couple met with our genetic counselor today. Patient's partner  had sent his sample for carrier screening. ---------------------------------------------------------------------- Recommendations  -An appointment was made for her to return in 6 weeks for  completion  of fetal anatomy. ----------------------------------------------------------------------                  Tama High, MD Electronically Signed Final Report   06/24/2021 03:52 pm ----------------------------------------------------------------------   Assessment and Plan:  Pregnancy: G2P0010 at 60w2d1. Alpha thalassemia silent carrier FOB also tested positive for this. They have already had genetic counseling, declined any further genetic counseling.   2. [redacted] weeks gestation of pregnancy 3. Supervision of high risk pregnancy, antepartum Follow up anatomy scan ordered.   Preterm labor symptoms and general obstetric precautions including but not limited to vaginal bleeding, contractions, leaking of fluid and fetal movement were reviewed in detail with the patient. Please refer to After Visit Summary for other counseling recommendations.   Return in about 4 weeks (around 08/17/2021) for OFFICE OB VISIT (MD or APP)  6 weeks from now: 2 hr GTT, 3rd trimester labs, TDap, OFFICE OB VISIT.  Future Appointments  Date Time Provider [place]  08/03/2021  8:00 AM [facility]    UVerita Schneiders MD

## 2021-07-20 NOTE — Patient Instructions (Signed)
Return to office for any scheduled appointments. Call the office or go to the MAU at [facility] if:  You begin to have strong, frequent contractions  Your water breaks.  Sometimes it is a big gush of fluid, sometimes it is just a trickle that keeps getting your panties wet or running down your legs  You have vaginal bleeding.  It is normal to have a small amount of spotting if your cervix was checked.   You do not feel your baby moving like normal.  If you do not, get something to eat and drink and lay down and focus on feeling your baby move.   If your baby is still not moving like normal, you should call the office or go to MAU.  Any other obstetric concerns.   

## 2021-07-29 ENCOUNTER — Other Ambulatory Visit: Payer: Self-pay

## 2021-07-29 DIAGNOSIS — D563 Thalassemia minor: Secondary | ICD-10-CM

## 2021-08-03 ENCOUNTER — Other Ambulatory Visit: Payer: Self-pay

## 2021-08-03 ENCOUNTER — Ambulatory Visit: Payer: BC Managed Care – PPO | Attending: Maternal & Fetal Medicine

## 2021-08-03 DIAGNOSIS — O3412 Maternal care for benign tumor of corpus uteri, second trimester: Secondary | ICD-10-CM

## 2021-08-03 DIAGNOSIS — O09522 Supervision of elderly multigravida, second trimester: Secondary | ICD-10-CM | POA: Diagnosis not present

## 2021-08-03 DIAGNOSIS — D259 Leiomyoma of uterus, unspecified: Secondary | ICD-10-CM | POA: Diagnosis not present

## 2021-08-03 DIAGNOSIS — O99830 Other infection carrier state complicating pregnancy: Secondary | ICD-10-CM | POA: Insufficient documentation

## 2021-08-03 DIAGNOSIS — Z3A24 24 weeks gestation of pregnancy: Secondary | ICD-10-CM | POA: Diagnosis not present

## 2021-08-03 DIAGNOSIS — D563 Thalassemia minor: Secondary | ICD-10-CM | POA: Insufficient documentation

## 2021-08-18 ENCOUNTER — Ambulatory Visit (INDEPENDENT_AMBULATORY_CARE_PROVIDER_SITE_OTHER): Payer: BC Managed Care – PPO | Admitting: Obstetrics & Gynecology

## 2021-08-18 ENCOUNTER — Encounter: Payer: Self-pay | Admitting: Obstetrics & Gynecology

## 2021-08-18 ENCOUNTER — Other Ambulatory Visit: Payer: Self-pay

## 2021-08-18 VITALS — BP 108/71 | HR 93 | Wt 152.0 lb

## 2021-08-18 DIAGNOSIS — O09519 Supervision of elderly primigravida, unspecified trimester: Secondary | ICD-10-CM

## 2021-08-18 DIAGNOSIS — Z3A26 26 weeks gestation of pregnancy: Secondary | ICD-10-CM

## 2021-08-18 DIAGNOSIS — O0990 Supervision of high risk pregnancy, unspecified, unspecified trimester: Secondary | ICD-10-CM

## 2021-08-18 MED ORDER — BREAST PUMP MISC: 0 refills | Status: DC

## 2021-08-18 NOTE — Patient Instructions (Signed)
Return to office for any scheduled appointments. Call the office or go to the MAU at [facility] if:  You begin to have strong, frequent contractions  Your water breaks.  Sometimes it is a big gush of fluid, sometimes it is just a trickle that keeps getting your panties wet or running down your legs  You have vaginal bleeding.  It is normal to have a small amount of spotting if your cervix was checked.   You do not feel your baby moving like normal.  If you do not, get something to eat and drink and lay down and focus on feeling your baby move.   If your baby is still not moving like normal, you should call the office or go to MAU.  Any other obstetric concerns.  TDaP Vaccine Pregnancy Get the Whooping Cough Vaccine While You Are Pregnant (CDC)  It is important for women to get the whooping cough vaccine in the third trimester of each pregnancy. Vaccines are the best way to prevent this disease. There are 2 different whooping cough vaccines. Both vaccines combine protection against whooping cough, tetanus and diphtheria, but they are for different age groups: Tdap: for everyone 11 years or older, including pregnant women  DTaP: for children 2 months through 6 years of age  You need the whooping cough vaccine during each of your pregnancies The recommended time to get the shot is during your 27th through 36th week of pregnancy, preferably during the earlier part of this time period. The Centers for Disease Control and Prevention (CDC) recommends that pregnant women receive the whooping cough vaccine for adolescents and adults (called Tdap vaccine) during the third trimester of each pregnancy. The recommended time to get the shot is during your 27th through 36th week of pregnancy, preferably during the earlier part of this time period. This replaces the original recommendation that pregnant women get the vaccine only if they had not previously received it. The  American College of Obstetricians and Gynecologists and the American College of Nurse-Midwives support this recommendation.  You should get the whooping cough vaccine while pregnant to pass protection to your baby frame support disabled and/or not supported in this browser  Learn why Laura decided to get the whooping cough vaccine in her 3rd trimester of pregnancy and how her baby girl was born with some protection against the disease. Also available on YouTube. After receiving the whooping cough vaccine, your body will create protective antibodies (proteins produced by the body to fight off diseases) and pass some of them to your baby before birth. These antibodies provide your baby some short-term protection against whooping cough in early life. These antibodies can also protect your baby from some of the more serious complications that come along with whooping cough. Your protective antibodies are at their highest about 2 weeks after getting the vaccine, but it takes time to pass them to your baby. So the preferred time to get the whooping cough vaccine is early in your third trimester. The amount of whooping cough antibodies in your body decreases over time. That is why CDC recommends you get a whooping cough vaccine during each pregnancy. Doing so allows each of your babies to get the greatest number of protective antibodies from you. This means each of your babies will get the best protection possible against this disease.  Getting the whooping cough vaccine while pregnant is better than getting the vaccine after you give birth Whooping cough vaccination during   pregnancy is ideal so your baby will have short-term protection as soon as he is born. This early protection is important because your baby will not start getting his whooping cough vaccines until he is 2 months old. These first few months of life are when your baby is at greatest risk for catching whooping cough. This is also when he's at  greatest risk for having severe, potentially life-threating complications from the infection. To avoid that gap in protection, it is best to get a whooping cough vaccine during pregnancy. You will then pass protection to your baby before he is born. To continue protecting your baby, he should get whooping cough vaccines starting at 2 months old. You may never have gotten the Tdap vaccine before and did not get it during this pregnancy. If so, you should make sure to get the vaccine immediately after you give birth, before leaving the hospital or birthing center. It will take about 2 weeks before your body develops protection (antibodies) in response to the vaccine. Once you have protection from the vaccine, you are less likely to give whooping cough to your newborn while caring for him. But remember, your baby will still be at risk for catching whooping cough from others. A recent study looked to see how effective Tdap was at preventing whooping cough in babies whose mothers got the vaccine while pregnant or in the hospital after giving birth. The study found that getting Tdap between 27 through 36 weeks of pregnancy is 85% more effective at preventing whooping cough in babies younger than 2 months old. Blood tests cannot tell if you need a whooping cough vaccine There are no blood tests that can tell you if you have enough antibodies in your body to protect yourself or your baby against whooping cough. Even if you have been sick with whooping cough in the past or previously received the vaccine, you still should get the vaccine during each pregnancy. Breastfeeding may pass some protective antibodies onto your baby By breastfeeding, you may pass some antibodies you have made in response to the vaccine to your baby. When you get a whooping cough vaccine during your pregnancy, you will have antibodies in your breast milk that you can share with your baby as soon as your milk comes in. However, your baby will not  get protective antibodies immediately if you wait to get the whooping cough vaccine until after delivering your baby. This is because it takes about 2 weeks for your body to create antibodies. Learn more about the health benefits of breastfeeding.  

## 2021-08-18 NOTE — Progress Notes (Signed)
   PRENATAL VISIT NOTE  Subjective:  Anne Long is a 35 y.o. G2P0010 at [redacted]w[redacted]d being seen today for ongoing prenatal care.  She is currently monitored for the following issues for this high-risk pregnancy and has Advanced maternal age, primigravida, antepartum; Abnormal glucose affecting pregnancy; Supervision of high risk pregnancy, antepartum; History of cryosurgery of cervix; Alpha thalassemia silent carrier; and Uterine fibroid complicating antenatal care on their problem list.  Patient reports no complaints.  Contractions: Not present. Vag. Bleeding: None.  Movement: Present. Denies leaking of fluid.   The following portions of the patient's history were reviewed and updated as appropriate: allergies, current medications, past family history, past medical history, past social history, past surgical history and problem list.   Objective:   Vitals:   08/18/21 0833  BP: 108/71  Pulse: 93  Weight: 152 lb (68.9 kg)    Fetal Status: Fetal Heart Rate (bpm): 147 Fundal Height: 28 cm Movement: Present     General:  Alert, oriented and cooperative. Patient is in no acute distress.  Skin: Skin is warm and dry. No rash noted.   Cardiovascular: Normal heart rate noted  Respiratory: Normal respiratory effort, no problems with respiration noted  Abdomen: Soft, gravid, appropriate for gestational age.  Pain/Pressure: Present     Pelvic: Cervical exam deferred        Extremities: Normal range of motion.  Edema: None  Mental Status: Normal mood and affect. Normal behavior. Normal judgment and thought content.   Assessment and Plan:  Pregnancy: G2P0010 at [redacted]w[redacted]d 1. Advanced maternal age, primigravida, antepartum 2. [redacted] weeks gestation of pregnancy 3. Supervision of high risk pregnancy, antepartum Breast pump prescribed as desired - Misc. Devices (BREAST PUMP) MISC; Dispense one breast pump for patient  Dispense: 1 each; Refill: 0 Answered questions about [place], labor restrictions etc.  The  nature of [facility] with multiple MDs and other Advanced Practice Providers was explained to patient; also emphasized that residents, students are part of our team. Third trimester labs, Tdap next visit.  Patient is scheduled for follow up scan as per MFM on 09/30/21, will follow up results. Preterm labor symptoms and general obstetric precautions including but not limited to vaginal bleeding, contractions, leaking of fluid and fetal movement were reviewed in detail with the patient. Please refer to After Visit Summary for other counseling recommendations.   Return in about 2 weeks (around 09/01/2021) for 2 hr GTT, 3rd trimester labs, TDap, OFFICE OB VISIT (MD or APP).  Future Appointments  Date Time Provider [place]  08/31/2021  8:15 AM Eadie Repetto, Sallyanne Havers, MD [facility]  09/30/2021  8:00 AM [facility]    Verita Schneiders, MD

## 2021-08-31 ENCOUNTER — Other Ambulatory Visit: Payer: Self-pay

## 2021-08-31 ENCOUNTER — Ambulatory Visit (INDEPENDENT_AMBULATORY_CARE_PROVIDER_SITE_OTHER): Payer: BC Managed Care – PPO | Admitting: Obstetrics & Gynecology

## 2021-08-31 ENCOUNTER — Encounter: Payer: Self-pay | Admitting: Obstetrics & Gynecology

## 2021-08-31 VITALS — BP 111/70 | HR 101 | Wt 153.0 lb

## 2021-08-31 DIAGNOSIS — O09519 Supervision of elderly primigravida, unspecified trimester: Secondary | ICD-10-CM

## 2021-08-31 DIAGNOSIS — O99013 Anemia complicating pregnancy, third trimester: Secondary | ICD-10-CM

## 2021-08-31 DIAGNOSIS — O3410 Maternal care for benign tumor of corpus uteri, unspecified trimester: Secondary | ICD-10-CM

## 2021-08-31 DIAGNOSIS — D259 Leiomyoma of uterus, unspecified: Secondary | ICD-10-CM

## 2021-08-31 DIAGNOSIS — Z23 Encounter for immunization: Secondary | ICD-10-CM

## 2021-08-31 DIAGNOSIS — O0990 Supervision of high risk pregnancy, unspecified, unspecified trimester: Secondary | ICD-10-CM

## 2021-08-31 DIAGNOSIS — Z3A28 28 weeks gestation of pregnancy: Secondary | ICD-10-CM

## 2021-08-31 MED ORDER — MISC. DEVICES MISC: 0 refills | Status: DC

## 2021-08-31 NOTE — Patient Instructions (Signed)
Return to office for any scheduled appointments. Call the office or go to the MAU at [facility] if:  You begin to have strong, frequent contractions  Your water breaks.  Sometimes it is a big gush of fluid, sometimes it is just a trickle that keeps getting your panties wet or running down your legs  You have vaginal bleeding.  It is normal to have a small amount of spotting if your cervix was checked.   You do not feel your baby moving like normal.  If you do not, get something to eat and drink and lay down and focus on feeling your baby move.   If your baby is still not moving like normal, you should call the office or go to MAU.  Any other obstetric concerns.   

## 2021-08-31 NOTE — Progress Notes (Signed)
PRENATAL VISIT NOTE  Subjective:  Anne Long is a 35 y.o. G2P0010 at [redacted]w[redacted]d being seen today for ongoing prenatal care.  She is currently monitored for the following issues for this high-risk pregnancy and has Advanced maternal age, primigravida, antepartum; Abnormal glucose affecting pregnancy; Supervision of high risk pregnancy, antepartum; History of cryosurgery of cervix; Alpha thalassemia silent carrier; and Uterine fibroid complicating antenatal care on their problem list.  Patient reports no complaints.  Contractions: Irritability. Vag. Bleeding: None.  Movement: Present. Denies leaking of fluid.   The following portions of the patient's history were reviewed and updated as appropriate: allergies, current medications, past family history, past medical history, past social history, past surgical history and problem list.   Objective:   Vitals:   08/31/21 0835  BP: 111/70  Pulse: (!) 101  Weight: 153 lb (69.4 kg)    Fetal Status: Fetal Heart Rate (bpm): 148 Fundal Height: 29 cm Movement: Present     General:  Alert, oriented and cooperative. Patient is in no acute distress.  Skin: Skin is warm and dry. No rash noted.   Cardiovascular: Normal heart rate noted  Respiratory: Normal respiratory effort, no problems with respiration noted  Abdomen: Soft, gravid, appropriate for gestational age.  Pain/Pressure: Absent     Pelvic: Cervical exam deferred        Extremities: Normal range of motion.  Edema: None  Mental Status: Normal mood and affect. Normal behavior. Normal judgment and thought content.    [place] MFM OB FOLLOW UP  Result Date: 08/03/2021 ----------------------------------------------------------------------  OBSTETRICS REPORT                        (Signed Final 08/03/2021 11:15 pm) ---------------------------------------------------------------------- Patient Info  ID #:       [id number]                          D.O.B.:  May 09, 1986 (35 yrs)  Name:       Anne Long                  Visit Date: 08/03/2021 07:56 am ---------------------------------------------------------------------- Performed By  Attending:        Sander Nephew      Ref. Address:      [place], [place]  [address]  Performed By:     Germain Osgood            [facility]                                                              [facility]  Referred By:      Aletha Halim MD ---------------------------------------------------------------------- Orders  #  Description                           Code        Ordered By  1  [place] MFM OB FOLLOW UP                   51025.85    Sander Nephew ----------------------------------------------------------------------  #  Order #                     Accession #                Episode #  1  [id number]                   [id number]                 [id number] ---------------------------------------------------------------------- Indications  Advanced maternal age multigravida 74+,         O14.522  second trimester  Alpha thalassemia silent carrier  Uterine fibroids affecting pregnancy in         O34.12, D25.9  second trimester, antepartum  Low Risk NIPS  [redacted] weeks gestation of pregnancy                 Z3A.24 ---------------------------------------------------------------------- Fetal Evaluation  Num Of Fetuses:          1  Fetal Heart Rate(bpm):   150  Cardiac Activity:        Observed  Presentation:            Cephalic  Placenta:                Anterior  P. Cord Insertion:       Visualized, central  Amniotic Fluid  AFI FV:      Within normal limits                               Largest Pocket(cm)  5.4 ---------------------------------------------------------------------- Biometry  BPD:      60.6  mm     G. Age:  24w 5d         57  %    CI:        70.65   %    70 - 86                                                          FL/HC:       18.1  %    18.7 - 20.9  HC:      229.8  mm     G. Age:  25w 0d         66  %    HC/AC:       1.16       1.05 - 1.21  AC:      198.3  mm     G. Age:  24w 3d         64  %    FL/BPD:      68.6  %    71 - 87  FL:       41.6  mm     G. Age:  23w 4d         16  %    FL/AC:       21.0  %    20 - 24  HUM:      37.4  mm     G. Age:  23w 1d         14  %  CER:      27.8  mm     G. Age:  24w 5d         71  %  LV:        3.6  mm  CM:        7.1  mm  Est. FW:     670   gm     1 lb 8 oz     36  % ---------------------------------------------------------------------- OB History  Gravidity:    2          SAB:   1  Living:       0 ---------------------------------------------------------------------- Gestational Age  LMP:           24w 2d        Date:  02/14/21                 EDD:   11/21/21  U/S Today:     24w 3d                                        EDD:   11/20/21  Best:          24w 2d     Det. By:  LMP  (02/14/21)          EDD:   11/21/21 ---------------------------------------------------------------------- Anatomy  Cranium:               Appears normal         LVOT:                   Appears normal  Cavum:                 Appears normal         Aortic Arch:            Previously seen  Ventricles:            Appears normal         Ductal Arch:            Appears normal  Choroid Plexus:        Previously seen        Diaphragm:              Appears normal  Cerebellum:            Appears normal         Stomach:                Appears normal, left                                                                        sided  Posterior Fossa:       Appears normal         Abdomen:                Appears normal  Nuchal Fold:           Previously  seen        Abdominal Wall:         Previously seen  Face:                  Orbits and profile     Cord Vessels:           Previously seen                         previously seen  Lips:                  Appears normal         Kidneys:                Appear normal  Palate:                Not well visualized    Bladder:                Appears normal  Thoracic:              Appears normal         Spine:                  Previously seen  Heart:                 Appears normal         Upper Extremities:      Previously seen                         (4CH, axis, and                         situs)  RVOT:  Appears normal         Lower Extremities:      Previously seen  Other:  Fetus appears to be female. ---------------------------------------------------------------------- Cervix Uterus Adnexa  Cervix  Not visualized (advanced GA >24wks)  Uterus  Multiple fibroids noted, see table below.  Right Ovary  Not visualized.  Left Ovary  Not visualized.  Cul De Sac  No free fluid seen.  Adnexa  No adnexal mass visualized. ---------------------------------------------------------------------- Myomas  Site                     L(cm)      W(cm)       D(cm)      Location  Anterior                 4.17       2.07        4.03  Anterior Right           4.93       2.4         5.2  Posterior Left           3.44       3.92        2.4 ----------------------------------------------------------------------  Blood Flow                  RI       PI       Comments ---------------------------------------------------------------------- Impression  Follow up growth to complete the fetal anatomy  Normal interval growth with measurements consistent with  dates  Good fetal movement and amniotic fluid volume ---------------------------------------------------------------------- Recommendations  Repeat limited exam in 2-3 weeks to assess fetal bowel ----------------------------------------------------------------------                Sander Nephew, MD Electronically Signed Final Report   08/03/2021 11:15 pm ----------------------------------------------------------------------   Assessment and Plan:  Pregnancy: G2P0010 at [redacted]w[redacted]d 1. Need for Tdap vaccination - Tdap vaccine greater than or equal to 7yo IM  2. Uterine fibroid complicating antenatal care Follow up scan scheduled.  3. [redacted] weeks gestation of pregnancy 4. Advanced maternal age, primigravida, antepartum 5. Supervision of high risk pregnancy, antepartum - Glucose Tolerance, 2 Hours w/1 Hour - CBC - RPR - HIV Antibody (routine testing w rflx) - Misc. Devices MISC; Dispense one maternity belt for patient  Dispense: 1 each; Refill: 0 Labs done today, will follow up results and manage accordingly. Discussed third trimester expectations, questions answered. Preterm labor symptoms and general obstetric precautions including but not limited to vaginal bleeding, contractions, leaking of fluid and fetal movement were reviewed in detail with the patient. Please refer to After Visit Summary for other counseling recommendations.   Return for any gynecologic concerns.  Future Appointments  Date Time Provider Department Center  09/15/2021  8:55 AM Donnamae Jude, MD [facility]  09/28/2021  8:35 AM Harolyn Rutherford, Sallyanne Havers, MD [facility]  09/30/2021  8:00 AM [facility]  10/13/2021  8:55 AM Donnamae Jude, MD [facility]  10/20/2021  8:55 AM Harolyn Rutherford, Sallyanne Havers, MD [facility]  10/26/2021  8:35 AM Donnamae Jude, MD [facility]  11/02/2021  8:35 AM Harolyn Rutherford, Sallyanne Havers, MD [facility]  11/09/2021  8:35 AM Darrelyn Morro, Sallyanne Havers, MD [facility]    Verita Schneiders, MD

## 2021-09-01 LAB — CBC
Hematocrit: 31.1 % — ABNORMAL LOW (ref 34.0–46.6)
Hemoglobin: 10 g/dL — ABNORMAL LOW (ref 11.1–15.9)
MCH: 26.4 pg — ABNORMAL LOW (ref 26.6–33.0)
MCHC: 32.2 g/dL (ref 31.5–35.7)
MCV: 82 fL (ref 79–97)
Platelets: 233 10*3/uL (ref 150–450)
RBC: 3.79 x10E6/uL (ref 3.77–5.28)
RDW: 13.1 % (ref 11.7–15.4)
WBC: 8.9 10*3/uL (ref 3.4–10.8)

## 2021-09-01 LAB — GLUCOSE TOLERANCE, 2 HOURS W/ 1HR
Glucose, 1 hour: 168 mg/dL (ref 70–179)
Glucose, 2 hour: 95 mg/dL (ref 70–152)
Glucose, Fasting: 79 mg/dL (ref 70–91)

## 2021-09-01 LAB — RPR: RPR Ser Ql: NONREACTIVE

## 2021-09-01 LAB — HIV ANTIBODY (ROUTINE TESTING W REFLEX): HIV Screen 4th Generation wRfx: NONREACTIVE

## 2021-09-02 DIAGNOSIS — O99019 Anemia complicating pregnancy, unspecified trimester: Secondary | ICD-10-CM | POA: Insufficient documentation

## 2021-09-02 DIAGNOSIS — O99013 Anemia complicating pregnancy, third trimester: Secondary | ICD-10-CM | POA: Insufficient documentation

## 2021-09-02 MED ORDER — IRON POLYSACCH CMPLX-B12-FA 150-0.025-1 MG PO CAPS: 1.0000 | ORAL_CAPSULE | ORAL | 3 refills | Status: DC

## 2021-09-02 MED ORDER — FERROUS SULFATE 325 (65 FE) MG PO TABS: 325.0000 mg | ORAL_TABLET | ORAL | 3 refills | Status: DC

## 2021-09-02 NOTE — Addendum Note (Signed)
Addended by: Verita Schneiders A on: 09/02/2021 07:40 AM   Modules accepted: Orders

## 2021-09-03 ENCOUNTER — Encounter: Payer: Self-pay | Admitting: Obstetrics & Gynecology

## 2021-09-12 NOTE — L&D Delivery Note (Signed)
Patient: Anne Long ?MRN: [id number] ? ?GBS status: neg ?Patient is a 36 y.o. now G2P1 s/p NSVD at [redacted]w[redacted]d who was admitted for SOL. No ROM 34h 524mrior to delivery with clear fluid.  ? ? ?Delivery Note ?At 1:39 PM a viable female was delivered via Vaginal, Spontaneous (Presentation:   Occiput Anterior).  APGAR: , ; weight 7 lb 13.9 oz (3570 g).   ?Placenta status: Spontaneous, Intact.  Cord: 3 vessels with the following complications: None.  Cord pH: 7.19 ? ?Anesthesia: Epidural ?Episiotomy:   ?Lacerations:  periurethral ?Suture Repair:  no repair ?Est. Blood Loss (mL):  200 ml ? ?Mom to postpartum.  Baby to NICU. ? ?Anne Reeve3/07/2022, 2:01 PM ? ? ? ? ?Head delivered OA. No nuchal cord present. Shoulder dystocia with McRoberts, s/p pressure. Delivery of posterior shoulder with spontaneous delivery following, 2 minute duration. Neonatal team present and cord cut immediately and sent to warmer Placenta delivered spontaneously with gentle cord traction. Fundus firm with massage and Pitocin. Perineum inspected and found to have no laceration  ? ? ?ArWoodroe ModeMD ?11/20/2021 ?2:13 PM ? ?

## 2021-09-15 ENCOUNTER — Ambulatory Visit (INDEPENDENT_AMBULATORY_CARE_PROVIDER_SITE_OTHER): Payer: BC Managed Care – PPO | Admitting: Family Medicine

## 2021-09-15 ENCOUNTER — Other Ambulatory Visit: Payer: Self-pay

## 2021-09-15 VITALS — BP 114/72 | HR 99 | Wt 154.0 lb

## 2021-09-15 DIAGNOSIS — O09519 Supervision of elderly primigravida, unspecified trimester: Secondary | ICD-10-CM

## 2021-09-15 DIAGNOSIS — K645 Perianal venous thrombosis: Secondary | ICD-10-CM

## 2021-09-15 DIAGNOSIS — O0990 Supervision of high risk pregnancy, unspecified, unspecified trimester: Secondary | ICD-10-CM

## 2021-09-15 DIAGNOSIS — D259 Leiomyoma of uterus, unspecified: Secondary | ICD-10-CM

## 2021-09-15 DIAGNOSIS — O3410 Maternal care for benign tumor of corpus uteri, unspecified trimester: Secondary | ICD-10-CM

## 2021-09-15 DIAGNOSIS — D563 Thalassemia minor: Secondary | ICD-10-CM

## 2021-09-15 MED ORDER — PROCTOFOAM HC 1-1 % EX FOAM: 1.0000 | Freq: Two times a day (BID) | CUTANEOUS | 2 refills | Status: DC

## 2021-09-15 NOTE — Progress Notes (Signed)
ROB [redacted]w[redacted]d  CC: pt notes a bump near rectum area , no longer painful, does bleed. Appeared about 1 wk ago.

## 2021-09-15 NOTE — Progress Notes (Signed)
° °  PRENATAL VISIT NOTE  Subjective:  Anne Long is a 36 y.o. G2P0010 at [redacted]w[redacted]d being seen today for ongoing prenatal care.  She is currently monitored for the following issues for this low-risk pregnancy and has Advanced maternal age, primigravida, antepartum; Supervision of high risk pregnancy, antepartum; History of cryosurgery of cervix; Alpha thalassemia silent carrier; Uterine fibroid complicating antenatal care; and Anemia in pregnancy, third trimester on their problem list.  Patient reports  bump at anus, which has been tender and bleeding. Denies constipation of straining with stools..  Contractions: Irritability. Vag. Bleeding: None.  Movement: Present. Denies leaking of fluid.   The following portions of the patient's history were reviewed and updated as appropriate: allergies, current medications, past family history, past medical history, past social history, past surgical history and problem list.   Objective:   Vitals:   09/15/21 0856  BP: 114/72  Pulse: 99  Weight: 154 lb (69.9 kg)    Fetal Status: Fetal Heart Rate (bpm): 152 Fundal Height: 28 cm Movement: Present     General:  Alert, oriented and cooperative. Patient is in no acute distress.  Skin: Skin is warm and dry. No rash noted.   Cardiovascular: Normal heart rate noted  Respiratory: Normal respiratory effort, no problems with respiration noted  Abdomen: Soft, gravid, appropriate for gestational age.  Pain/Pressure: Present     Pelvic: Cervical exam performed in the presence of a chaperone, 2 hemorrhoids, one is thrombosed        Extremities: Normal range of motion.  Edema: None  Mental Status: Normal mood and affect. Normal behavior. Normal judgment and thought content.   Assessment and Plan:  Pregnancy: G2P0010 at [redacted]w[redacted]d 1. Supervision of high risk pregnancy, antepartum Continue routine prenatal care. Scheduled for childbirth classes Discussed pediatrician  2. Uterine fibroid complicating antenatal  care Has f/u u/s scheduled  3. Advanced maternal age, primigravida, antepartum Low risk female  4. Alpha thalassemia silent carrier Husband is a carrier as well s/p genetics  5. Hemorrhoid thrombosis Trial of proctofoam - hydrocortisone-pramoxine (PROCTOFOAM HC) rectal foam; Place 1 applicator rectally 2 (two) times daily.  Dispense: 10 g; Refill: 2  Preterm labor symptoms and general obstetric precautions including but not limited to vaginal bleeding, contractions, leaking of fluid and fetal movement were reviewed in detail with the patient. Please refer to After Visit Summary for other counseling recommendations.   No follow-ups on file.  Future Appointments  Date Time Provider [place]  09/28/2021  8:35 AM Anyanwu, Sallyanne Havers, MD [facility]  09/30/2021  8:00 AM [facility]  10/13/2021  8:55 AM Donnamae Jude, MD [facility]  10/20/2021  8:55 AM Harolyn Rutherford, Sallyanne Havers, MD [facility]  10/26/2021  8:35 AM Donnamae Jude, MD [facility]  11/02/2021  8:35 AM Harolyn Rutherford, Sallyanne Havers, MD [facility]  11/09/2021  8:35 AM Anyanwu, Sallyanne Havers, MD [facility]    Donnamae Jude, MD

## 2021-09-28 ENCOUNTER — Other Ambulatory Visit: Payer: Self-pay

## 2021-09-28 ENCOUNTER — Encounter: Payer: Self-pay | Admitting: Obstetrics & Gynecology

## 2021-09-28 ENCOUNTER — Ambulatory Visit (INDEPENDENT_AMBULATORY_CARE_PROVIDER_SITE_OTHER): Payer: BC Managed Care – PPO | Admitting: Obstetrics & Gynecology

## 2021-09-28 VITALS — BP 109/71 | HR 93 | Wt 155.0 lb

## 2021-09-28 DIAGNOSIS — Z3A32 32 weeks gestation of pregnancy: Secondary | ICD-10-CM

## 2021-09-28 DIAGNOSIS — D219 Benign neoplasm of connective and other soft tissue, unspecified: Secondary | ICD-10-CM

## 2021-09-28 DIAGNOSIS — D563 Thalassemia minor: Secondary | ICD-10-CM

## 2021-09-28 DIAGNOSIS — O09519 Supervision of elderly primigravida, unspecified trimester: Secondary | ICD-10-CM

## 2021-09-28 DIAGNOSIS — O99013 Anemia complicating pregnancy, third trimester: Secondary | ICD-10-CM

## 2021-09-28 DIAGNOSIS — O0990 Supervision of high risk pregnancy, unspecified, unspecified trimester: Secondary | ICD-10-CM

## 2021-09-28 NOTE — Progress Notes (Signed)
° °  PRENATAL VISIT NOTE  Subjective:  Anne Long is a 36 y.o. G2P0010 at [redacted]w[redacted]d being seen today for ongoing prenatal care.  She is currently monitored for the following issues for this high-risk pregnancy and has Advanced maternal age, antepartum; Supervision of high risk pregnancy, antepartum; History of cryosurgery of cervix; Alpha thalassemia silent carrier; Uterine fibroid complicating antenatal care; and Anemia in pregnancy, third trimester on their problem list.  Patient reports no complaints.  Contractions: Not present. Vag. Bleeding: None.  Movement: Present. Denies leaking of fluid.   The following portions of the patient's history were reviewed and updated as appropriate: allergies, current medications, past family history, past medical history, past social history, past surgical history and problem list.   Objective:   Vitals:   09/28/21 0902  BP: 109/71  Pulse: 93  Weight: 155 lb (70.3 kg)    Fetal Status: Fetal Heart Rate (bpm): 150   Movement: Present     General:  Alert, oriented and cooperative. Patient is in no acute distress.  Skin: Skin is warm and dry. No rash noted.   Cardiovascular: Normal heart rate noted  Respiratory: Normal respiratory effort, no problems with respiration noted  Abdomen: Soft, gravid, appropriate for gestational age.  Pain/Pressure: Present     Pelvic: Cervical exam deferred        Extremities: Normal range of motion.  Edema: None  Mental Status: Normal mood and affect. Normal behavior. Normal judgment and thought content.   Assessment and Plan:  Pregnancy: G2P0010 at [redacted]w[redacted]d 1. Anemia in pregnancy, third trimester On iron therapy, CBC to be rechecked next visit.  2. [redacted] weeks gestation of pregnancy 3. Advanced maternal age, antepartum 4. Supervision of high risk pregnancy, antepartum Follow up MFM scan on 09/30/21. Preterm labor symptoms and general obstetric precautions including but not limited to vaginal bleeding, contractions,  leaking of fluid and fetal movement were reviewed in detail with the patient. Please refer to After Visit Summary for other counseling recommendations.   Return for OFFICE OB VISIT (MD only).  Future Appointments  Date Time Provider [place]  09/30/2021  8:00 AM [facility]  10/13/2021  8:55 AM Donnamae Jude, MD [facility]  10/26/2021  8:35 AM Donnamae Jude, MD [facility]  11/02/2021  8:35 AM Harolyn Rutherford, Sallyanne Havers, MD [facility]  11/09/2021  8:35 AM Harolyn Rutherford, Sallyanne Havers, MD [facility]  11/16/2021  8:35 AM Lavanda Nevels, Sallyanne Havers, MD [facility]    Verita Schneiders, MD

## 2021-09-30 ENCOUNTER — Ambulatory Visit: Payer: BC Managed Care – PPO | Attending: Obstetrics

## 2021-09-30 ENCOUNTER — Other Ambulatory Visit: Payer: Self-pay

## 2021-09-30 DIAGNOSIS — O09519 Supervision of elderly primigravida, unspecified trimester: Secondary | ICD-10-CM

## 2021-09-30 DIAGNOSIS — D259 Leiomyoma of uterus, unspecified: Secondary | ICD-10-CM | POA: Diagnosis not present

## 2021-09-30 DIAGNOSIS — D563 Thalassemia minor: Secondary | ICD-10-CM

## 2021-09-30 DIAGNOSIS — D219 Benign neoplasm of connective and other soft tissue, unspecified: Secondary | ICD-10-CM

## 2021-09-30 DIAGNOSIS — O09523 Supervision of elderly multigravida, third trimester: Secondary | ICD-10-CM | POA: Insufficient documentation

## 2021-09-30 DIAGNOSIS — O09513 Supervision of elderly primigravida, third trimester: Secondary | ICD-10-CM | POA: Diagnosis not present

## 2021-09-30 DIAGNOSIS — O3413 Maternal care for benign tumor of corpus uteri, third trimester: Secondary | ICD-10-CM

## 2021-09-30 DIAGNOSIS — Z3A32 32 weeks gestation of pregnancy: Secondary | ICD-10-CM | POA: Insufficient documentation

## 2021-10-13 ENCOUNTER — Other Ambulatory Visit: Payer: Self-pay

## 2021-10-13 ENCOUNTER — Ambulatory Visit (INDEPENDENT_AMBULATORY_CARE_PROVIDER_SITE_OTHER): Payer: BC Managed Care – PPO | Admitting: Family Medicine

## 2021-10-13 VITALS — BP 113/73 | HR 98 | Wt 157.0 lb

## 2021-10-13 DIAGNOSIS — D563 Thalassemia minor: Secondary | ICD-10-CM

## 2021-10-13 DIAGNOSIS — D259 Leiomyoma of uterus, unspecified: Secondary | ICD-10-CM

## 2021-10-13 DIAGNOSIS — O99013 Anemia complicating pregnancy, third trimester: Secondary | ICD-10-CM

## 2021-10-13 DIAGNOSIS — O3410 Maternal care for benign tumor of corpus uteri, unspecified trimester: Secondary | ICD-10-CM

## 2021-10-13 DIAGNOSIS — O0990 Supervision of high risk pregnancy, unspecified, unspecified trimester: Secondary | ICD-10-CM

## 2021-10-13 LAB — CBC
Hematocrit: 29.6 % — ABNORMAL LOW (ref 34.0–46.6)
Hemoglobin: 9.9 g/dL — ABNORMAL LOW (ref 11.1–15.9)
MCH: 26.6 pg (ref 26.6–33.0)
MCHC: 33.4 g/dL (ref 31.5–35.7)
MCV: 80 fL (ref 79–97)
Platelets: 215 10*3/uL (ref 150–450)
RBC: 3.72 x10E6/uL — ABNORMAL LOW (ref 3.77–5.28)
RDW: 14.3 % (ref 11.7–15.4)
WBC: 7.6 10*3/uL (ref 3.4–10.8)

## 2021-10-13 NOTE — Progress Notes (Signed)
° °  PRENATAL VISIT NOTE  Subjective:  Anne Long is a 36 y.o. G2P0010 at [redacted]w[redacted]d being seen today for ongoing prenatal care.  She is currently monitored for the following issues for this low-risk pregnancy and has Advanced maternal age, antepartum; Supervision of high risk pregnancy, antepartum; History of cryosurgery of cervix; Alpha thalassemia silent carrier; Uterine fibroid complicating antenatal care; and Anemia in pregnancy, third trimester on their problem list.  Patient reports  feeling bad after taking iron, makes her woozy, would like to stop taking this .  Contractions: Not present. Vag. Bleeding: None.  Movement: Present. Denies leaking of fluid.   The following portions of the patient's history were reviewed and updated as appropriate: allergies, current medications, past family history, past medical history, past social history, past surgical history and problem list.   Objective:   Vitals:   10/13/21 0856  BP: 113/73  Pulse: 98  Weight: 157 lb (71.2 kg)    Fetal Status: Fetal Heart Rate (bpm): 135 Fundal Height: 34 cm Movement: Present     General:  Alert, oriented and cooperative. Patient is in no acute distress.  Skin: Skin is warm and dry. No rash noted.   Cardiovascular: Normal heart rate noted  Respiratory: Normal respiratory effort, no problems with respiration noted  Abdomen: Soft, gravid, appropriate for gestational age.  Pain/Pressure: Absent     Pelvic: Cervical exam deferred        Extremities: Normal range of motion.  Edema: None  Mental Status: Normal mood and affect. Normal behavior. Normal judgment and thought content.   Assessment and Plan:  Pregnancy: G2P0010 at [redacted]w[redacted]d 1. Uterine fibroid complicating antenatal care   2. Supervision of high risk pregnancy, antepartum Cultures next week  3. Alpha thalassemia silent carrier   4. Anemia in pregnancy, third trimester Repeat CBC and call if can stop her iron - CBC  Preterm labor symptoms and  general obstetric precautions including but not limited to vaginal bleeding, contractions, leaking of fluid and fetal movement were reviewed in detail with the patient. Please refer to After Visit Summary for other counseling recommendations.   Return in 2 weeks (on 10/27/2021) for in person.  Future Appointments  Date Time Provider Department Center  10/26/2021  8:35 AM Donnamae Jude, MD [facility]  11/02/2021  8:35 AM Harolyn Rutherford, Sallyanne Havers, MD [facility]  11/09/2021  8:35 AM Harolyn Rutherford, Sallyanne Havers, MD [facility]  11/16/2021  8:35 AM Anyanwu, Sallyanne Havers, MD [facility]    Donnamae Jude, MD

## 2021-10-13 NOTE — Patient Instructions (Signed)

## 2021-10-15 ENCOUNTER — Encounter (INDEPENDENT_AMBULATORY_CARE_PROVIDER_SITE_OTHER): Payer: Self-pay | Admitting: *Deleted

## 2021-10-20 ENCOUNTER — Encounter: Payer: BC Managed Care – PPO | Admitting: Obstetrics & Gynecology

## 2021-10-26 ENCOUNTER — Other Ambulatory Visit: Payer: Self-pay

## 2021-10-26 ENCOUNTER — Ambulatory Visit (INDEPENDENT_AMBULATORY_CARE_PROVIDER_SITE_OTHER): Payer: BC Managed Care – PPO | Admitting: Family Medicine

## 2021-10-26 ENCOUNTER — Other Ambulatory Visit (HOSPITAL_COMMUNITY)
Admission: RE | Admit: 2021-10-26 | Discharge: 2021-10-26 | Disposition: A | Discharge status: Home / Self Care | Payer: BC Managed Care – PPO | Source: Ambulatory Visit | Attending: Family Medicine | Admitting: Family Medicine

## 2021-10-26 VITALS — BP 111/70 | HR 98 | Wt 157.0 lb

## 2021-10-26 DIAGNOSIS — O0990 Supervision of high risk pregnancy, unspecified, unspecified trimester: Secondary | ICD-10-CM

## 2021-10-26 DIAGNOSIS — O3410 Maternal care for benign tumor of corpus uteri, unspecified trimester: Secondary | ICD-10-CM

## 2021-10-26 DIAGNOSIS — D259 Leiomyoma of uterus, unspecified: Secondary | ICD-10-CM

## 2021-10-26 DIAGNOSIS — O09519 Supervision of elderly primigravida, unspecified trimester: Secondary | ICD-10-CM

## 2021-10-26 LAB — OB RESULTS CONSOLE GC/CHLAMYDIA: Gonorrhea: NEGATIVE

## 2021-10-26 NOTE — Progress Notes (Signed)
° °  PRENATAL VISIT NOTE  Subjective:  Anne Long is a 36 y.o. G2P0010 at [redacted]w[redacted]d being seen today for ongoing prenatal care.  She is currently monitored for the following issues for this high-risk pregnancy and has Advanced maternal age, antepartum; Supervision of high risk pregnancy, antepartum; History of cryosurgery of cervix; Alpha thalassemia silent carrier; Uterine fibroid complicating antenatal care; and Anemia in pregnancy, third trimester on their problem list.  Patient reports no complaints.  Contractions: Not present. Vag. Bleeding: None.  Movement: Absent. Denies leaking of fluid.   The following portions of the patient's history were reviewed and updated as appropriate: allergies, current medications, past family history, past medical history, past social history, past surgical history and problem list.   Objective:   Vitals:   10/26/21 0846  BP: 111/70  Pulse: 98  Weight: 157 lb (71.2 kg)    Fetal Status: Fetal Heart Rate (bpm): 154 Fundal Height: 37 cm Movement: Absent  Presentation: Vertex  General:  Alert, oriented and cooperative. Patient is in no acute distress.  Skin: Skin is warm and dry. No rash noted.   Cardiovascular: Normal heart rate noted  Respiratory: Normal respiratory effort, no problems with respiration noted  Abdomen: Soft, gravid, appropriate for gestational age.  Pain/Pressure: Present     Pelvic: Cervical exam performed in the presence of a chaperone Dilation: Fingertip Effacement (%): 70 Station: -3  Extremities: Normal range of motion.  Edema: None  Mental Status: Normal mood and affect. Normal behavior. Normal judgment and thought content.   Assessment and Plan:  Pregnancy: G2P0010 at [redacted]w[redacted]d 1. Supervision of high risk pregnancy, antepartum Cultures today Labor signs reviewed - GC/Chlamydia probe amp ([place])not at [facility] - Strep Gp B NAA  2. Advanced maternal age, antepartum Low risk NIPT  3. Uterine fibroid complicating antenatal  care   Preterm labor symptoms and general obstetric precautions including but not limited to vaginal bleeding, contractions, leaking of fluid and fetal movement were reviewed in detail with the patient. Please refer to After Visit Summary for other counseling recommendations.   Return in 1 week (on 11/02/2021).  Future Appointments  Date Time Provider [place]  11/02/2021  8:35 AM Anyanwu, Sallyanne Havers, MD [facility]  11/09/2021  8:35 AM Anyanwu, Sallyanne Havers, MD [facility]  11/16/2021  8:35 AM Anyanwu, Sallyanne Havers, MD [facility]    Donnamae Jude, MD

## 2021-10-26 NOTE — Patient Instructions (Signed)

## 2021-10-27 LAB — GC/CHLAMYDIA PROBE AMP (~~LOC~~) NOT AT ARMC
Chlamydia: NEGATIVE
Comment: NEGATIVE
Comment: NORMAL
Neisseria Gonorrhea: NEGATIVE

## 2021-10-28 LAB — STREP GP B NAA: Strep Gp B NAA: NEGATIVE

## 2021-11-02 ENCOUNTER — Ambulatory Visit (INDEPENDENT_AMBULATORY_CARE_PROVIDER_SITE_OTHER): Payer: BC Managed Care – PPO | Admitting: Obstetrics & Gynecology

## 2021-11-02 ENCOUNTER — Encounter: Payer: Self-pay | Admitting: Obstetrics & Gynecology

## 2021-11-02 ENCOUNTER — Other Ambulatory Visit: Payer: Self-pay

## 2021-11-02 VITALS — BP 121/74 | HR 98 | Wt 159.0 lb

## 2021-11-02 DIAGNOSIS — Z3A37 37 weeks gestation of pregnancy: Secondary | ICD-10-CM

## 2021-11-02 DIAGNOSIS — O99013 Anemia complicating pregnancy, third trimester: Secondary | ICD-10-CM

## 2021-11-02 DIAGNOSIS — O0990 Supervision of high risk pregnancy, unspecified, unspecified trimester: Secondary | ICD-10-CM

## 2021-11-02 NOTE — Patient Instructions (Signed)
Return to office for any scheduled appointments. Call the office or go to the MAU at [facility] [place] if:  You begin to have strong, frequent contractions  Your water breaks.  Sometimes it is a big gush of fluid, sometimes it is just a trickle that keeps getting your panties wet or running down your legs  You have vaginal bleeding.  It is normal to have a small amount of spotting if your cervix was checked.   You do not feel your baby moving like normal.  If you do not, get something to eat and drink and lay down and focus on feeling your baby move.   If your baby is still not moving like normal, you should call the office or go to MAU.  Any other obstetric concerns.   

## 2021-11-02 NOTE — Progress Notes (Signed)
° °  PRENATAL VISIT NOTE  Subjective:  Anne Long is a 36 y.o. G2P0010 at 109w2d being seen today for ongoing prenatal care.  She is currently monitored for the following issues for this high-risk pregnancy and has Advanced maternal age, antepartum; Supervision of high risk pregnancy, antepartum; History of cryosurgery of cervix; Alpha thalassemia silent carrier; Uterine fibroid complicating antenatal care; and Anemia in pregnancy, third trimester on their problem list.  Patient reports no complaints.  Contractions: Not present. Vag. Bleeding: None.  Movement: Present. Denies leaking of fluid.   The following portions of the patient's history were reviewed and updated as appropriate: allergies, current medications, past family history, past medical history, past social history, past surgical history and problem list.   Objective:   Vitals:   11/02/21 0831  BP: 121/74  Pulse: 98  Weight: 159 lb (72.1 kg)    Fetal Status: Fetal Heart Rate (bpm): 134 Fundal Height: 38 cm Movement: Present     General:  Alert, oriented and cooperative. Patient is in no acute distress.  Skin: Skin is warm and dry. No rash noted.   Cardiovascular: Normal heart rate noted  Respiratory: Normal respiratory effort, no problems with respiration noted  Abdomen: Soft, gravid, appropriate for gestational age.  Pain/Pressure: Absent     Pelvic: Cervical exam deferred        Extremities: Normal range of motion.  Edema: None  Mental Status: Normal mood and affect. Normal behavior. Normal judgment and thought content.   Assessment and Plan:  Pregnancy: G2P0010 at [redacted]w[redacted]d 1. Anemia in pregnancy, third trimester Continue iron supplements.  2. [redacted] weeks gestation of pregnancy 3. Supervision of high risk pregnancy, antepartum Labor symptoms and general obstetric precautions including but not limited to vaginal bleeding, contractions, leaking of fluid and fetal movement were reviewed in detail with the patient. Please  refer to After Visit Summary for other counseling recommendations.   Return in about 1 week (around 11/09/2021) for OFFICE OB VISIT (MD or APP).  Future Appointments  Date Time Provider [place]  11/09/2021  2:30 PM Doyal Saric, Sallyanne Havers, MD [facility]  11/16/2021  8:35 AM Loan Oguin, Sallyanne Havers, MD [facility]    Verita Schneiders, MD

## 2021-11-09 ENCOUNTER — Encounter: Payer: BC Managed Care – PPO | Admitting: Obstetrics & Gynecology

## 2021-11-09 ENCOUNTER — Other Ambulatory Visit: Payer: Self-pay

## 2021-11-09 ENCOUNTER — Ambulatory Visit (INDEPENDENT_AMBULATORY_CARE_PROVIDER_SITE_OTHER): Payer: BC Managed Care – PPO | Admitting: Obstetrics & Gynecology

## 2021-11-09 ENCOUNTER — Encounter: Payer: Self-pay | Admitting: Obstetrics & Gynecology

## 2021-11-09 ENCOUNTER — Telehealth (HOSPITAL_COMMUNITY): Payer: Self-pay | Admitting: *Deleted

## 2021-11-09 VITALS — BP 118/72 | HR 91 | Wt 159.0 lb

## 2021-11-09 DIAGNOSIS — O09519 Supervision of elderly primigravida, unspecified trimester: Secondary | ICD-10-CM

## 2021-11-09 DIAGNOSIS — Z3A38 38 weeks gestation of pregnancy: Secondary | ICD-10-CM

## 2021-11-09 DIAGNOSIS — O0990 Supervision of high risk pregnancy, unspecified, unspecified trimester: Secondary | ICD-10-CM

## 2021-11-09 DIAGNOSIS — L0232 Furuncle of buttock: Secondary | ICD-10-CM

## 2021-11-09 MED ORDER — CEFADROXIL 500 MG PO CAPS: 500.0000 mg | ORAL_CAPSULE | Freq: Two times a day (BID) | ORAL | 0 refills | Status: DC

## 2021-11-09 NOTE — Progress Notes (Signed)
° °  PRENATAL VISIT NOTE  Subjective:  Anne Long is a 36 y.o. G2P0010 at [redacted]w[redacted]d being seen today for ongoing prenatal care.  She is currently monitored for the following issues for this high-risk pregnancy and has Advanced maternal age, antepartum; Supervision of high risk pregnancy, antepartum; History of cryosurgery of cervix; Alpha thalassemia silent carrier; Uterine fibroid complicating antenatal care; and Anemia in pregnancy, third trimester on their problem list.  Patient reports painful lesion near her tailbone, wants this evaluated.  Contractions: Irregular. Vag. Bleeding: None.  Movement: Present. Denies leaking of fluid.   The following portions of the patient's history were reviewed and updated as appropriate: allergies, current medications, past family history, past medical history, past social history, past surgical history and problem list.   Objective:   Vitals:   11/09/21 1440  BP: 118/72  Pulse: 91  Weight: 159 lb (72.1 kg)    Fetal Status: Fetal Heart Rate (bpm): 154 Fundal Height: 39 cm Movement: Present     General:  Alert, oriented and cooperative. Patient is in no acute distress.  Skin: Skin is warm and dry. No rash noted.   Cardiovascular: Normal heart rate noted  Respiratory: Normal respiratory effort, no problems with respiration noted  Abdomen: Soft, gravid, appropriate for gestational age.  Pain/Pressure: Present     Pelvic:  1 cm indurated subcutaneous lesion in upper, medial aspect of right buttock. No erythema, no fluctuance. Tender to touch. Cervical exam deferred. Exam performed in the presence of a chaperone        Extremities: Normal range of motion.  Edema: None  Mental Status: Normal mood and affect. Normal behavior. Normal judgment and thought content.   Assessment and Plan:  Pregnancy: G2P0010 at [redacted]w[redacted]d 1. Boil of buttock Recommended warm compresses to area. Duricef prescribed to cover for infection. Will reevaluate next week. - cefadroxil  (DURICEF) 500 MG capsule; Take 1 capsule (500 mg total) by mouth 2 (two) times daily.  Dispense: 14 capsule; Refill: 0  2. [redacted] weeks gestation of pregnancy 3. Advanced maternal age, antepartum 4. Supervision of high risk pregnancy, antepartum Discussed IOL at 40 weeks, patient desires this.  She desires IOL at 40 weeks.  Risks and benefits of induction were reviewed, including failure of method, prolonged labor, need for further intervention, risk of cesarean section.  Patient understand these risks and wish to proceed.  Options of misoprostol foley bulb, artificial rupture of membranes, and pitocin reviewed, with use of each discussed in detail. All questions answered.  Induction of labor scheduled on 11/21/21 at midnight, orders have been signed and held. She was told to expect a call from [facility] L[facility]D with further instructions about her pre-admission COVID screening and any further instructions about the induction of labor.  Preterm labor symptoms and general obstetric precautions including but not limited to vaginal bleeding, contractions, leaking of fluid and fetal movement were reviewed in detail with the patient. Please refer to After Visit Summary for other counseling recommendations.   Return in about 1 week (around 11/16/2021) for OFFICE OB VISIT as scheduled.  Future Appointments  Date Time Provider [place]  11/16/2021  8:35 AM Kristine Chahal, Sallyanne Havers, MD [facility]  11/21/2021 12:00 AM [facility] SCHED ROOM [facility] None    Verita Schneiders, MD

## 2021-11-09 NOTE — Patient Instructions (Addendum)
Induction of labor scheduled for 11/21/2021 at midnight, came to hospital Saturday 11/20/2021 at 11:45pm  Return to office for any scheduled appointments. Call the office or go to the MAU at [place] [facility] if: You begin to have strong, frequent contractions Your water breaks.  Sometimes it is a big gush of fluid, sometimes it is just a trickle that keeps getting your panties wet or running down your legs You have vaginal bleeding.  It is normal to have a small amount of spotting if your cervix was checked.  You do not feel your baby moving like normal.  If you do not, get something to eat and drink and lay down and focus on feeling your baby move.   If your baby is still not moving like normal, you should call the office or go to MAU. Any other obstetric concerns.   Cervical Ripening (to get your cervix ready for labor) : May try one or all:  Red Raspberry Leaf capsules:  two 300mg  or 400mg  tablets with each meal, 2-3 times a day  Potential Side Effects Of Raspberry Leaf:  Most women do not experience any side effects from drinking raspberry leaf tea. However, nausea and loose stools are possible   Evening Primrose Oil capsules: may take 1 to 3 capsules daily. May also prick one to release the oil and insert it into your vagina at night.  Some of the potential side effects:  Upset stomach  Loose stools or diarrhea  Headaches  Nausea  6 Dates a day (may taste better if warmed in microwave until soft). Found where raisins are in the grocery store  Spinning babies

## 2021-11-09 NOTE — Telephone Encounter (Signed)
Preadmission screen  

## 2021-11-11 ENCOUNTER — Encounter (HOSPITAL_COMMUNITY): Payer: Self-pay | Admitting: *Deleted

## 2021-11-11 ENCOUNTER — Telehealth (HOSPITAL_COMMUNITY): Payer: Self-pay | Admitting: *Deleted

## 2021-11-11 NOTE — Telephone Encounter (Signed)
Preadmission screen  

## 2021-11-13 ENCOUNTER — Encounter: Payer: Self-pay | Admitting: Obstetrics & Gynecology

## 2021-11-14 ENCOUNTER — Other Ambulatory Visit: Payer: Self-pay | Admitting: Family Medicine

## 2021-11-16 ENCOUNTER — Inpatient Hospital Stay (HOSPITAL_BASED_OUTPATIENT_CLINIC_OR_DEPARTMENT_OTHER): Payer: BC Managed Care – PPO

## 2021-11-16 ENCOUNTER — Inpatient Hospital Stay (EMERGENCY_DEPARTMENT_HOSPITAL)
Admission: AD | Admit: 2021-11-16 | Discharge: 2021-11-16 | Disposition: A | Discharge status: Home / Self Care | Payer: BC Managed Care – PPO | Source: Home / Self Care | Attending: Obstetrics and Gynecology | Admitting: Obstetrics and Gynecology

## 2021-11-16 ENCOUNTER — Telehealth: Payer: Self-pay | Admitting: *Deleted

## 2021-11-16 ENCOUNTER — Encounter: Payer: BC Managed Care – PPO | Admitting: Obstetrics & Gynecology

## 2021-11-16 ENCOUNTER — Encounter (HOSPITAL_COMMUNITY): Payer: Self-pay | Admitting: Obstetrics and Gynecology

## 2021-11-16 DIAGNOSIS — N939 Abnormal uterine and vaginal bleeding, unspecified: Secondary | ICD-10-CM

## 2021-11-16 DIAGNOSIS — Z3A39 39 weeks gestation of pregnancy: Secondary | ICD-10-CM | POA: Insufficient documentation

## 2021-11-16 DIAGNOSIS — O368130 Decreased fetal movements, third trimester, not applicable or unspecified: Secondary | ICD-10-CM | POA: Insufficient documentation

## 2021-11-16 DIAGNOSIS — O3413 Maternal care for benign tumor of corpus uteri, third trimester: Secondary | ICD-10-CM

## 2021-11-16 DIAGNOSIS — Z3689 Encounter for other specified antenatal screening: Secondary | ICD-10-CM | POA: Diagnosis not present

## 2021-11-16 DIAGNOSIS — O4693 Antepartum hemorrhage, unspecified, third trimester: Secondary | ICD-10-CM | POA: Diagnosis not present

## 2021-11-16 LAB — WET PREP, GENITAL
Clue Cells Wet Prep HPF POC: NONE SEEN
Sperm: NONE SEEN
Trich, Wet Prep: NONE SEEN
WBC, Wet Prep HPF POC: <10
Yeast Wet Prep HPF POC: NONE SEEN

## 2021-11-16 NOTE — MAU Note (Addendum)
Patient arrived from home complaining of ctx/ tightness that started yesterday at 12 noon. Patient stated that she noticed vaginal bleeding an hour and a half ago. She is currently wearing a panty liner. ?Patient stated that when she woke up she did not feel the baby move for about and hour and a half. At this present time she does feel the baby stretching/ moving, but is unsure if it is her normal movements.   ?No leakage of fluid reported ? ?

## 2021-11-16 NOTE — Telephone Encounter (Signed)
Called pt regarding her after hours message with bleeding this AM. Pt states when she woke up she noticed some blood in her underwear and in the toilet  and states baby is not moving like she normally does in the AM. Pt does have ROB this AM, advised to go to MAU instead. Pt verbalizes and understands ?

## 2021-11-16 NOTE — MAU Provider Note (Addendum)
Patient Anne Long is a 36 y.o. G2P0010 at 76w2dhere with complaints of contractions yesterday. She rested and they went away. She reports some spotting this morning and some "low fetal movement this morning". She still sees some dark red bleeding right now.  ? ?She denies nausea, vomiting, diarrhea, HA, floating spots, signs of COVID. She reports she has a history of high blood sugar at the beginning of pregnancy but passsed her GTT. She also reports anemia that "got better". She does endorse a boil that is being treated with abx.  ? ?She reports that baby is moving more now.  ?She denies recent IC in past 24 hours.  ?History  ?  ? ?CSN: [id number]? ?Arrival date and time: 11/16/21 0900 ? ? Event Date/Time  ? First Provider Initiated Contact with Patient 11/16/21 [phone]  ?  ? ?Chief Complaint  ?Patient presents with  ? Vaginal Bleeding  ? Contractions  ? Decreased Fetal Movement  ? ?Vaginal Bleeding ?The patient's primary symptoms include vaginal bleeding. This is a new problem. The current episode started yesterday. The problem occurs constantly. The problem has been gradually worsening. Pertinent negatives include no constipation, diarrhea, dysuria, fever, urgency or vomiting. The vaginal discharge was bloody. The vaginal bleeding is lighter than menses.  ? ?OB History   ? ? Gravida  ?2  ? Para  ?0  ? Term  ?0  ? Preterm  ?0  ? AB  ?1  ? Living  ?0  ?  ? ? SAB  ?1  ? IAB  ?0  ? Ectopic  ?0  ? Multiple  ?0  ? Live Births  ?0  ?   ?  ?  ? ? ?Past Medical History:  ?Diagnosis Date  ? 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 Seen on colposcopy after ASCUS +HPV pap smear '[ ]'$  Cryotherapy  ? Fibroid   ? Vaginal Pap smear, abnormal   ? ? ?Past Surgical History:  ?Procedure Laterality Date  ? GYNECOLOGIC CRYOSURGERY    ? ? ?Family History  ?Problem Relation Age of Onset  ? Ovarian cancer Maternal Grandmother 739 ? Diabetes Paternal Grandmother   ? ? ?Social History  ? ?Tobacco Use  ? Smoking status: Never  ?  Smokeless tobacco: Never  ?Substance Use Topics  ? Alcohol use: No  ? Drug use: No  ? ? ?Allergies:  ?Allergies  ?Allergen Reactions  ? Other Swelling  ?  Shrimp  ? ? ?Medications Prior to Admission  ?Medication Sig Dispense Refill Last Dose  ? Prenatal Vit-Fe Fumarate-FA (PRENATAL VITAMINS PO) Take by mouth.   11/15/2021  ? cefadroxil (DURICEF) 500 MG capsule Take 1 capsule (500 mg total) by mouth 2 (two) times daily. 14 capsule 0   ? Iron Polysacch Cmplx-B12-FA 150-0.025-1 MG CAPS Take 1 tablet by mouth every other day. 30 capsule 3   ? Misc. Devices (BREAST PUMP) MISC Dispense one breast pump for patient (Patient not taking: Reported on 11/02/2021) 1 each 0   ? Misc. Devices MISC Dispense one maternity belt for patient (Patient not taking: Reported on 11/02/2021) 1 each 0   ? ? ?Review of Systems  ?Constitutional: Negative.  Negative for fever.  ?HENT: Negative.    ?Respiratory: Negative.    ?Cardiovascular: Negative.   ?Gastrointestinal:  Negative for constipation, diarrhea and vomiting.  ?Genitourinary:  Positive for vaginal bleeding. Negative for dysuria and urgency.  ?Neurological: Negative.   ?Physical Exam  ? ?Blood pressure 122/76,  pulse (!) 106, temperature 98.4 ?F (36.9 ?C), temperature source Oral, resp. rate 12, height '4\' 11"'$  (1.499 m), weight 72.5 kg, last menstrual period 02/14/2021. ? ?Physical Exam ?Constitutional:   ?   Appearance: Normal appearance.  ?Cardiovascular:  ?   Rate and Rhythm: Normal rate.  ?   Pulses: Normal pulses.  ?Pulmonary:  ?   Effort: Pulmonary effort is normal.  ?Abdominal:  ?   General: Abdomen is flat.  ?Genitourinary: ?   Comments: NEFG; trace amounnt of dark brown mucous and blood in vagina, cervix is long, closed; no CMT ?Musculoskeletal:     ?   General: Normal range of motion.  ?Skin: ?   General: Skin is warm.  ?Neurological:  ?   General: No focal deficit present.  ?   Mental Status: She is alert.  ?Psychiatric:     ?   Mood and Affect: Mood normal.  ? ? ?MAU Course   ?Procedures ? ?MDM ?-NST is reassuring: 140 bpm, mod var, present acel, no decels, irregular contractions ?-will send patient for [place] to look for abruption ? ?Reassessment (10:48 AM) ?Patient doing well, reporting strong fetal movements. She denies any more vaginal bleeding.  ?Check of peripad shows no bleeding on pad.  ?Patient wet prep negative for any signs of infection; she declines check for STI ? ?Reassessment (11:34 AM) ?-patient in [place] . [place] was done to rule out placental abruption; which can be fatal to fetus. I have independently reviewed the [place] images, which reveal finding of infant with cephalic presentation and no signs of abruption.  ? ? ?Reassessment (12:15 PM) ?-patient reports no more bleeding; contractions are now 20 min apart (per partner) ?-[place] reviewed; no signs of abruption and patient has had no bleeding.  ? ? ?Assessment and Plan  ? ?1. NST (non-stress test) reactive   ?2. [redacted] weeks gestation of pregnancy   ?-call [place] for follow up appt today ?-patient would like to talk about her IOL date; recommended that she talk with her provider at [facility] about post-dates IOL ?-reviewed signs of SROM, VB precuations, signs of labor and when to come to MAU. Long discussion with patient about SROM and when to come to hospital.  ?-all questions answered.  ? ?Mervyn Skeeters C[place] ?11/16/2021, 9:44 AM  ?

## 2021-11-17 ENCOUNTER — Inpatient Hospital Stay (EMERGENCY_DEPARTMENT_HOSPITAL)
Admission: AD | Admit: 2021-11-17 | Discharge: 2021-11-18 | Disposition: A | Discharge status: Home / Self Care | Payer: BC Managed Care – PPO | Source: Home / Self Care | Attending: Obstetrics & Gynecology | Admitting: Obstetrics & Gynecology

## 2021-11-17 ENCOUNTER — Telehealth (INDEPENDENT_AMBULATORY_CARE_PROVIDER_SITE_OTHER): Payer: BC Managed Care – PPO | Admitting: Family Medicine

## 2021-11-17 ENCOUNTER — Encounter (HOSPITAL_COMMUNITY): Payer: Self-pay | Admitting: Obstetrics & Gynecology

## 2021-11-17 ENCOUNTER — Other Ambulatory Visit: Payer: Self-pay

## 2021-11-17 VITALS — BP 118/78 | HR 103 | Wt 160.0 lb

## 2021-11-17 DIAGNOSIS — O09519 Supervision of elderly primigravida, unspecified trimester: Secondary | ICD-10-CM

## 2021-11-17 DIAGNOSIS — O471 False labor at or after 37 completed weeks of gestation: Secondary | ICD-10-CM | POA: Insufficient documentation

## 2021-11-17 DIAGNOSIS — Z3A39 39 weeks gestation of pregnancy: Secondary | ICD-10-CM | POA: Insufficient documentation

## 2021-11-17 DIAGNOSIS — O0990 Supervision of high risk pregnancy, unspecified, unspecified trimester: Secondary | ICD-10-CM

## 2021-11-17 DIAGNOSIS — O99013 Anemia complicating pregnancy, third trimester: Secondary | ICD-10-CM

## 2021-11-17 DIAGNOSIS — O479 False labor, unspecified: Secondary | ICD-10-CM

## 2021-11-17 NOTE — Progress Notes (Signed)
? ?  PRENATAL VISIT NOTE ? ?Subjective:  ?Anne Long is a 36 y.o. G2P0010 at 41w3dbeing seen today for ongoing prenatal care.  She is currently monitored for the following issues for this low-risk pregnancy and has Advanced maternal age, antepartum; Supervision of high risk pregnancy, antepartum; History of cryosurgery of cervix; Alpha thalassemia silent carrier; Uterine fibroid complicating antenatal care; and Anemia in pregnancy, third trimester on their problem list. ? ?Patient reports  vaginal bleeding .  Contractions: Irregular. Vag. Bleeding: Small.  Movement: Present. Denies leaking of fluid.  ? ?The following portions of the patient's history were reviewed and updated as appropriate: allergies, current medications, past family history, past medical history, past social history, past surgical history and problem list.  ? ?Objective:  ? ?Vitals:  ? 11/17/21 0905  ?BP: 118/78  ?Pulse: (!) 103  ?Weight: 160 lb (72.6 kg)  ? ? ?Fetal Status: Fetal Heart Rate (bpm): 155 Fundal Height: 37 cm Movement: Present  Presentation: Vertex ? ?General:  Alert, oriented and cooperative. Patient is in no acute distress.  ?Skin: Skin is warm and dry. No rash noted.   ?Cardiovascular: Normal heart rate noted  ?Respiratory: Normal respiratory effort, no problems with respiration noted  ?Abdomen: Soft, gravid, appropriate for gestational age.  Pain/Pressure: Present     ?Pelvic: Cervical exam performed in the presence of a chaperone Dilation: 1.5 Effacement (%): 70, 80 Station: -2 bloody show noted  ?Extremities: Normal range of motion.  Edema: None  ?Mental Status: Normal mood and affect. Normal behavior. Normal judgment and thought content.  ? ?Assessment and Plan:  ?Pregnancy: G2P0010 at 369w3d1. Anemia in pregnancy, third trimester ? ? ?2. Advanced maternal age, primigravida, antepartum ?Low risk NIPT ? ?3. Supervision of high risk pregnancy, antepartum ?Probably in early labor--if bleeding persists, come in for IOL ?Has  declined IOL at 40 weeks, feels strongly about avoiding IOL, moved to 41 weeks ?Will have OB visit with antenatal testing next week for postdates. ? ?Preterm labor symptoms and general obstetric precautions including but not limited to vaginal bleeding, contractions, leaking of fluid and fetal movement were reviewed in detail with the patient. ?Please refer to After Visit Summary for other counseling recommendations.  ? ?Return in 1 week (on 11/24/2021). ? ?Future Appointments  ?Date Time Provider [place]?11/24/2021  1:00 PM [facility] NURSE [facility]  ?11/24/2021  2:00 PM PrDonnamae JudeMD [facility]  ?11/28/2021 12:00 AM [facility] SCHED ROOM [facility] None  ? ? ?TaDonnamae JudeMD ? ? ?

## 2021-11-17 NOTE — MAU Note (Signed)
Pt reports ctx's for the last two days. Pt reports they worsened in the last two days.  ? ?Reports bloody show ? ?Denies LOF  ? ?Reports +FM  ?

## 2021-11-17 NOTE — Progress Notes (Signed)
Still having bleeding, pt notices it more when she uses the bathroom, and when we she wipes.  ?

## 2021-11-18 ENCOUNTER — Inpatient Hospital Stay (HOSPITAL_COMMUNITY): Payer: BC Managed Care – PPO | Admitting: Anesthesiology

## 2021-11-18 ENCOUNTER — Inpatient Hospital Stay (HOSPITAL_COMMUNITY)
Admission: AD | Admit: 2021-11-18 | Discharge: 2021-11-22 | DRG: 806 | Disposition: A | Discharge status: Home / Self Care | Payer: BC Managed Care – PPO | Attending: Obstetrics & Gynecology | Admitting: Obstetrics & Gynecology

## 2021-11-18 ENCOUNTER — Encounter (HOSPITAL_COMMUNITY): Payer: Self-pay | Admitting: Obstetrics and Gynecology

## 2021-11-18 ENCOUNTER — Other Ambulatory Visit: Payer: Self-pay

## 2021-11-18 DIAGNOSIS — O9081 Anemia of the puerperium: Secondary | ICD-10-CM | POA: Diagnosis not present

## 2021-11-18 DIAGNOSIS — O8612 Endometritis following delivery: Secondary | ICD-10-CM | POA: Diagnosis not present

## 2021-11-18 DIAGNOSIS — O3413 Maternal care for benign tumor of corpus uteri, third trimester: Secondary | ICD-10-CM | POA: Diagnosis present

## 2021-11-18 DIAGNOSIS — D563 Thalassemia minor: Secondary | ICD-10-CM | POA: Diagnosis present

## 2021-11-18 DIAGNOSIS — Z3A40 40 weeks gestation of pregnancy: Secondary | ICD-10-CM | POA: Diagnosis not present

## 2021-11-18 DIAGNOSIS — O26893 Other specified pregnancy related conditions, third trimester: Secondary | ICD-10-CM | POA: Diagnosis present

## 2021-11-18 DIAGNOSIS — D62 Acute posthemorrhagic anemia: Secondary | ICD-10-CM | POA: Diagnosis not present

## 2021-11-18 DIAGNOSIS — Z20822 Contact with and (suspected) exposure to covid-19: Secondary | ICD-10-CM | POA: Diagnosis present

## 2021-11-18 DIAGNOSIS — O479 False labor, unspecified: Secondary | ICD-10-CM | POA: Diagnosis not present

## 2021-11-18 DIAGNOSIS — D259 Leiomyoma of uterus, unspecified: Secondary | ICD-10-CM | POA: Diagnosis present

## 2021-11-18 DIAGNOSIS — O09519 Supervision of elderly primigravida, unspecified trimester: Secondary | ICD-10-CM

## 2021-11-18 DIAGNOSIS — Z3A39 39 weeks gestation of pregnancy: Secondary | ICD-10-CM

## 2021-11-18 DIAGNOSIS — O4292 Full-term premature rupture of membranes, unspecified as to length of time between rupture and onset of labor: Secondary | ICD-10-CM | POA: Diagnosis present

## 2021-11-18 DIAGNOSIS — O480 Post-term pregnancy: Secondary | ICD-10-CM | POA: Diagnosis not present

## 2021-11-18 LAB — CBC
HCT: 35.6 % — ABNORMAL LOW (ref 36.0–46.0)
Hemoglobin: 11.4 g/dL — ABNORMAL LOW (ref 12.0–15.0)
MCH: 26.1 pg (ref 26.0–34.0)
MCHC: 32 g/dL (ref 30.0–36.0)
MCV: 81.7 fL (ref 80.0–100.0)
Platelets: 272 10*3/uL (ref 150–400)
RBC: 4.36 MIL/uL (ref 3.87–5.11)
RDW: 15.5 % (ref 11.5–15.5)
WBC: 11.1 10*3/uL — ABNORMAL HIGH (ref 4.0–10.5)
nRBC: 0 % (ref 0.0–0.2)

## 2021-11-18 LAB — TYPE AND SCREEN
ABO/RH(D): A POS
Antibody Screen: NEGATIVE

## 2021-11-18 LAB — RESP PANEL BY RT-PCR (FLU A&B, COVID) ARPGX2
Influenza A by PCR: NEGATIVE
Influenza B by PCR: NEGATIVE
SARS Coronavirus 2 by RT PCR: NEGATIVE

## 2021-11-18 LAB — POCT FERN TEST: POCT Fern Test: NEGATIVE

## 2021-11-18 MED ORDER — EPHEDRINE 5 MG/ML INJ: 10.0000 mg | INTRAVENOUS | Status: DC | PRN

## 2021-11-18 MED ORDER — SOD CITRATE-CITRIC ACID 500-334 MG/5ML PO SOLN
30.0000 mL | ORAL | Status: DC | PRN
Start: 2021-11-18 — End: 2021-11-20

## 2021-11-18 MED ORDER — OXYCODONE-ACETAMINOPHEN 5-325 MG PO TABS: 1.0000 | ORAL_TABLET | ORAL | Status: DC | PRN

## 2021-11-18 MED ORDER — DIPHENHYDRAMINE HCL 50 MG/ML IJ SOLN: 12.5000 mg | INTRAMUSCULAR | Status: DC | PRN

## 2021-11-18 MED ORDER — LIDOCAINE HCL (PF) 1 % IJ SOLN
INTRAMUSCULAR | Status: DC | PRN
  Administered 2021-11-18 (×2): 4 mL via EPIDURAL

## 2021-11-18 MED ORDER — OXYTOCIN-SODIUM CHLORIDE 30-0.9 UT/500ML-% IV SOLN
2.5000 [IU]/h | INTRAVENOUS | Status: DC
  Filled 2021-11-18: qty 500

## 2021-11-18 MED ORDER — OXYTOCIN BOLUS FROM INFUSION
333.0000 mL | Freq: Once | INTRAVENOUS | Status: AC
  Administered 2021-11-20: 333 mL via INTRAVENOUS

## 2021-11-18 MED ORDER — ZOLPIDEM TARTRATE 5 MG PO TABS
5.0000 mg | ORAL_TABLET | Freq: Every evening | ORAL | Status: DC | PRN
Start: 2021-11-18 — End: 2021-11-20

## 2021-11-18 MED ORDER — FENTANYL CITRATE (PF) 100 MCG/2ML IJ SOLN: 50.0000 ug | INTRAMUSCULAR | Status: DC | PRN

## 2021-11-18 MED ORDER — FENTANYL-BUPIVACAINE-NACL 0.5-0.125-0.9 MG/250ML-% EP SOLN
12.0000 mL/h | EPIDURAL | Status: DC | PRN
  Administered 2021-11-18: 23:00:00 10 mL/h via EPIDURAL
  Filled 2021-11-18 (×2): qty 250

## 2021-11-18 MED ORDER — ACETAMINOPHEN 325 MG PO TABS: 650.0000 mg | ORAL_TABLET | ORAL | Status: DC | PRN

## 2021-11-18 MED ORDER — FLEET ENEMA 7-19 GM/118ML RE ENEM
1.0000 | ENEMA | Freq: Every day | RECTAL | Status: DC | PRN
Start: 2021-11-18 — End: 2021-11-20

## 2021-11-18 MED ORDER — LACTATED RINGERS IV SOLN
500.0000 mL | INTRAVENOUS | Status: DC | PRN
  Administered 2021-11-20 (×2): 500 mL via INTRAVENOUS

## 2021-11-18 MED ORDER — OXYTOCIN-SODIUM CHLORIDE 30-0.9 UT/500ML-% IV SOLN
1.0000 m[IU]/min | INTRAVENOUS | Status: DC
  Administered 2021-11-19: 2 m[IU]/min via INTRAVENOUS

## 2021-11-18 MED ORDER — FAMOTIDINE IN NACL 20-0.9 MG/50ML-% IV SOLN
20.0000 mg | Freq: Once | INTRAVENOUS | Status: AC
  Administered 2021-11-18: 20 mg via INTRAVENOUS
  Filled 2021-11-18: qty 50

## 2021-11-18 MED ORDER — TERBUTALINE SULFATE 1 MG/ML IJ SOLN: 0.2500 mg | Freq: Once | INTRAMUSCULAR | Status: DC | PRN

## 2021-11-18 MED ORDER — LIDOCAINE HCL (PF) 1 % IJ SOLN
30.0000 mL | INTRAMUSCULAR | Status: DC | PRN
Start: 2021-11-18 — End: 2021-11-20

## 2021-11-18 MED ORDER — LACTATED RINGERS IV SOLN: INTRAVENOUS | Status: DC

## 2021-11-18 MED ORDER — ONDANSETRON HCL 4 MG/2ML IJ SOLN
4.0000 mg | Freq: Four times a day (QID) | INTRAMUSCULAR | Status: DC | PRN
  Filled 2021-11-18: qty 2

## 2021-11-18 MED ORDER — PHENYLEPHRINE 40 MCG/ML (10ML) SYRINGE FOR IV PUSH (FOR BLOOD PRESSURE SUPPORT)
80.0000 ug | PREFILLED_SYRINGE | INTRAVENOUS | Status: DC | PRN
  Administered 2021-11-18: 23:00:00 80 ug via INTRAVENOUS

## 2021-11-18 MED ORDER — OXYCODONE-ACETAMINOPHEN 5-325 MG PO TABS: 2.0000 | ORAL_TABLET | ORAL | Status: DC | PRN

## 2021-11-18 MED ORDER — PHENYLEPHRINE 40 MCG/ML (10ML) SYRINGE FOR IV PUSH (FOR BLOOD PRESSURE SUPPORT)
80.0000 ug | PREFILLED_SYRINGE | INTRAVENOUS | Status: DC | PRN
  Filled 2021-11-18: qty 10

## 2021-11-18 MED ORDER — HYDROXYZINE HCL 50 MG PO TABS
50.0000 mg | ORAL_TABLET | Freq: Four times a day (QID) | ORAL | Status: DC | PRN
  Filled 2021-11-18: qty 1

## 2021-11-18 MED ORDER — LACTATED RINGERS IV SOLN
500.0000 mL | Freq: Once | INTRAVENOUS | Status: AC
  Administered 2021-11-18: 23:00:00 500 mL via INTRAVENOUS

## 2021-11-18 NOTE — Anesthesia Procedure Notes (Signed)
Epidural ?Patient location during procedure: OB ?Start time: 11/18/2021 10:37 PM ?End time: 11/18/2021 10:40 PM ? ?Staffing ?Anesthesiologist: Brennan Bailey, MD ?Performed: anesthesiologist  ? ?Preanesthetic Checklist ?Completed: patient identified, IV checked, risks and benefits discussed, monitors and equipment checked, pre-op evaluation and timeout performed ? ?Epidural ?Patient position: sitting ?Prep: DuraPrep and site prepped and draped ?Patient monitoring: continuous pulse ox, blood pressure and heart rate ?Approach: midline ?Location: L3-L4 ?Injection technique: LOR air ? ?Needle:  ?Needle type: Tuohy  ?Needle gauge: 17 G ?Needle length: 9 cm ?Needle insertion depth: 4 cm ?Catheter type: closed end flexible ?Catheter size: 19 Gauge ?Catheter at skin depth: 9 cm ?Test dose: negative and Other (1% lidocaine) ? ?Assessment ?Events: blood not aspirated, injection not painful, no injection resistance, no paresthesia and negative IV test ? ?Additional Notes ?Patient identified. Risks, benefits, and alternatives discussed with patient including but not limited to bleeding, infection, nerve damage, paralysis, failed block, incomplete pain control, headache, blood pressure changes, nausea, vomiting, reactions to medication, itching, and postpartum back pain. Confirmed with bedside nurse the patient's most recent platelet count. Confirmed with patient that they are not currently taking any anticoagulation, have any bleeding history, or any family history of bleeding disorders. Patient expressed understanding and wished to proceed. All questions were answered. Sterile technique was used throughout the entire procedure. Please see nursing notes for vital signs.  ? ?Crisp LOR on first pass. Test dose was given through epidural catheter and negative prior to continuing to dose epidural or start infusion. Warning signs of high block given to the patient including shortness of breath, tingling/numbness in hands, complete  motor block, or any concerning symptoms with instructions to call for help. Patient was given instructions on fall risk and not to get out of bed. All questions and concerns addressed with instructions to call with any issues or inadequate analgesia.  Reason for block:procedure for pain ? ? ? ?

## 2021-11-18 NOTE — H&P (Addendum)
OBSTETRIC ADMISSION HISTORY AND PHYSICAL ? ?Anne Long is a 36 y.o. female G2P0010 with IUP at [redacted]w[redacted]d by LMP presenting for SOL complicated by . She reports +FMs, No LOF, no VB, no blurry vision, headaches or peripheral edema, and RUQ pain.  She plans on breast feeding. She is undecided about birth control and will discuss further. ?She received her prenatal care at  [facility]   ? ?Dating: By LMP --->  Estimated Date of Delivery: 11/21/21 ? ?Sono: ?$RemoveB'@[redacted]w[redacted]d'tFKyUAez$ , CWD, normal anatomy, cephalic presentation, left antero-lateral lie, 2169g, 64% EFW ? ?Prenatal History/Complications: ?-Hx of cryosurgery of cervix ?-Iron deficiency anemia ?-Uterine fibroids complicating antenatal care ? ?Past Medical History: ?Past Medical History:  ?Diagnosis Date  ? 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 Seen on colposcopy after ASCUS +HPV pap smear $RemoveB'[ ]'mpYHjfRN$  Cryotherapy  ? Fibroid   ? Vaginal Pap smear, abnormal   ? ? ?Past Surgical History: ?Past Surgical History:  ?Procedure Laterality Date  ? GYNECOLOGIC CRYOSURGERY    ? ? ?Obstetrical History: ?OB History   ? ? Gravida  ?2  ? Para  ?0  ? Term  ?0  ? Preterm  ?0  ? AB  ?1  ? Living  ?0  ?  ? ? SAB  ?1  ? IAB  ?0  ? Ectopic  ?0  ? Multiple  ?0  ? Live Births  ?0  ?   ?  ?  ? ? ?Social History ?Social History  ? ?Socioeconomic History  ? Marital status: Married  ?  Spouse name: Not on file  ? Number of children: Not on file  ? Years of education: Not on file  ? Highest education level: Not on file  ?Occupational History  ? Occupation: Pharmacist, hospital  ?  Employer: [employer]  ?Tobacco Use  ? Smoking status: Never  ? Smokeless tobacco: Never  ?Vaping Use  ? Vaping Use: Never used  ?Substance and Sexual Activity  ? Alcohol use: No  ? Drug use: No  ? Sexual activity: Yes  ?  Birth control/protection: None  ?Other Topics Concern  ? Not on file  ?Social History Narrative  ? Not on file  ? ?Social Determinants of Health  ? ?Financial Resource Strain: Not on file  ?Food Insecurity: Not on  file  ?Transportation Needs: Not on file  ?Physical Activity: Not on file  ?Stress: Not on file  ?Social Connections: Not on file  ? ? ?Family History: ?Family History  ?Problem Relation Age of Onset  ? Ovarian cancer Maternal Grandmother 76  ? Diabetes Paternal Grandmother   ? ? ?Allergies: ?Allergies  ?Allergen Reactions  ? Other Swelling  ?  Shrimp  ? ? ?Medications Prior to Admission  ?Medication Sig Dispense Refill Last Dose  ? cefadroxil (DURICEF) 500 MG capsule Take 1 capsule (500 mg total) by mouth 2 (two) times daily. 14 capsule 0   ? Iron Polysacch Cmplx-B12-FA 150-0.025-1 MG CAPS Take 1 tablet by mouth every other day. 30 capsule 3   ? Misc. Devices (BREAST PUMP) MISC Dispense one breast pump for patient (Patient not taking: Reported on 11/02/2021) 1 each 0   ? Misc. Devices MISC Dispense one maternity belt for patient (Patient not taking: Reported on 11/02/2021) 1 each 0   ? Prenatal Vit-Fe Fumarate-FA (PRENATAL VITAMINS PO) Take by mouth.     ? ? ? ?Review of Systems  ? ?All systems reviewed and negative except as stated in HPI ? ?Blood pressure 127/70,  pulse 92, temperature 98.5 ?F (36.9 ?C), resp. rate 19, height $RemoveBe'4\' 11"'fwCYLhvtR$  (1.499 m), weight 71.9 kg, last menstrual period 02/14/2021, SpO2 100 %. ?General appearance: alert, cooperative, and no distress ?Lungs: Normal work of breathing on room air ?Heart: Regular 2+ pulses bilaterally ?Abdomen: soft, non-tender ?Extremities: Homans sign is negative, no sign of DVT ?Presentation: cephalic ?Fetal monitoring - Baseline: 140 bpm, Variability: Good {> 6 bpm), Accelerations: Reactive, and Decelerations: Absent ?Uterine activity - Frequency: Every 5-6 minutes ?Dilation: 4 ?Effacement (%): 80 ?Station: -2 ?Exam by:: Earnest Conroy RN ? ? ?Prenatal labs: ?ABO, Rh: A/Positive/-- (08/16 1533) ?Antibody: Negative (08/16 1533) ?Rubella: 2.08 (08/16 1533) ?RPR: Non Reactive (12/20 0839)  ?HBsAg: Negative (08/16 1533)  ?HIV: Non Reactive (12/20 0839)  ?GBS: Negative/-- (02/14  7741)  ?2 hr Glucola: Normal ?Genetic screening: NIPS low risk female/AFP negative/Horizon alpha thal silent carrier, FOB also silent carrier ?Anatomy US: Normal ? ?Prenatal Transfer Tool  ?Maternal Diabetes: No ?Genetic Screening: Abnormal:  Results: Other: Horizon alpha thal silent carrier, FOB also silent carrier ?Maternal Ultrasounds/Referrals: Normal ?Fetal Ultrasounds or other Referrals:  None ?Maternal Substance Abuse:  No ?Significant Maternal Medications:  None ?Significant Maternal Lab Results: Group B Strep negative ? ?No results found for this or any previous visit (from the past 24 hour(s)). ? ?Patient Active Problem List  ? Diagnosis Date Noted  ? Indication for care in labor or delivery 11/18/2021  ? Anemia in pregnancy, third trimester 09/02/2021  ? Uterine fibroid complicating antenatal care 06/25/2021  ? Alpha thalassemia silent carrier 05/18/2021  ? Supervision of high risk pregnancy, antepartum 05/03/2021  ? History of cryosurgery of cervix 05/03/2021  ? Advanced maternal age, antepartum 04/27/2021  ? ? ?Assessment/Plan:  ?Anne Long is a 36 y.o. G2P0010 at [redacted]w[redacted]d here for SOL. ? ?#Labor: Patient was experiencing regular painful contractions in the MAU and they remained steadily consistent after recent epidural placement. Will place urinary foley catheter imminently. Expectant management with continued close monitoring. ?#Pain: Epidural placed ?#FWB: Category 1 ?#ID:  GBS negative ?#MOF: Breast ?#MOC: Undecided, will discuss further soon ?#Circ:  N/A ? ? ?Dimitry Mining engineer, Medical Student  ?11/18/2021, 8:57 PM ? ? ?I personally saw and evaluated the patient, performing the key elements of the service. I developed and verified the management plan that is described in the resident's/student's note, and I agree with the content with my edits above. VSS, HRR&R, Resp unlabored, Legs neg.  ?Nigel Berthold, CNM ?11/19/2021 ?2:32 AM ?  ? ?  ?

## 2021-11-18 NOTE — MAU Provider Note (Addendum)
Laquinda Moller is a 36 y.o. G88P0010 female at [redacted]w[redacted]d ? ?RN labor check, not seen by provider. ? ?SVE by RN: Dilation: 1.5 ?Effacement (%): 70, 80 ?Station: -2 ?Exam by:: NElray Mcgregor RN ? ?NST: FHR baseline 150 bpm, Variability: moderate, Accelerations: present, Decelerations:  Absent= Cat 1/Reactive ?Toco: Every 5-7 minutes ? ?Assessment/Plan: ?Cervix unchanged on recheck per RN. Cat 1/reactive tracing. False labor. Stable for discharge with return precautions. ? ?SKarsten Fells DO PGY-1 ?11/18/2021, 12:26 AM ? ?GME ATTESTATION:  ?I agree with the findings and the plan of care as documented in the resident?s note. I have made changes to documentation as necessary. ? ?CVilma Meckel MD ?OB Fellow, Faculty Practice ?[facility] ?11/18/2021 12:40 AM ? ?

## 2021-11-18 NOTE — Anesthesia Preprocedure Evaluation (Signed)
Anesthesia Evaluation  Patient identified by MRN, date of birth, ID band Patient awake    Reviewed: Allergy & Precautions, Patient's Chart, lab work & pertinent test results  History of Anesthesia Complications Negative for: history of anesthetic complications  Airway Mallampati: II  TM Distance: >3 FB Neck ROM: Full    Dental no notable dental hx.    Pulmonary neg pulmonary ROS,    Pulmonary exam normal        Cardiovascular negative cardio ROS Normal cardiovascular exam     Neuro/Psych negative neurological ROS  negative psych ROS   GI/Hepatic negative GI ROS, Neg liver ROS,   Endo/Other  negative endocrine ROS  Renal/GU negative Renal ROS  negative genitourinary   Musculoskeletal negative musculoskeletal ROS (+)   Abdominal   Peds  Hematology negative hematology ROS (+)   Anesthesia Other Findings Day of surgery medications reviewed with patient.  Reproductive/Obstetrics (+) Pregnancy                             Anesthesia Physical Anesthesia Plan  ASA: 2  Anesthesia Plan: Epidural   Post-op Pain Management:    Induction:   PONV Risk Score and Plan: Treatment may vary due to age or medical condition  Airway Management Planned: Natural Airway  Additional Equipment: Fetal Monitoring  Intra-op Plan:   Post-operative Plan:   Informed Consent: I have reviewed the patients History and Physical, chart, labs and discussed the procedure including the risks, benefits and alternatives for the proposed anesthesia with the patient or authorized representative who has indicated his/her understanding and acceptance.       Plan Discussed with:   Anesthesia Plan Comments:         Anesthesia Quick Evaluation  

## 2021-11-18 NOTE — MAU Note (Signed)
Anne Long is a 36 y.o. at 22w4dhere in MAU reporting: ctx that have been going on for a while and got more intense this morning leading into the afternoon. States they come about every 3 minutes. Reports some bloody show, unsure if water broke, before getting into the car "felt like a big gush of pee". +FM. States Anne Long lost mucous plug today as well.  ? ? ?Onset of complaint:  ?Pain score: 10  ?Vitals:  ? 11/18/21 1949  ?BP: 127/70  ?Pulse: 92  ?Resp: 19  ?Temp: 98.5 ?F (36.9 ?C)  ?SpO2: 100%  ?   ?FHT:164 ?Lab orders placed from triage: none  ? ?

## 2021-11-19 LAB — RPR: RPR Ser Ql: NONREACTIVE

## 2021-11-19 MED ORDER — FAMOTIDINE IN NACL 20-0.9 MG/50ML-% IV SOLN
20.0000 mg | Freq: Once | INTRAVENOUS | Status: AC
  Administered 2021-11-19: 20 mg via INTRAVENOUS
  Filled 2021-11-19: qty 50

## 2021-11-19 NOTE — Progress Notes (Addendum)
Patient ID: Anne Long, female   DOB: May 02, 1986, 36 y.o.   MRN: [id number] ? ? ?Anne Long is a 36 y.o. G2P0010 at [redacted]w[redacted]d admitted for induction of labor due to premature rupture of membranes. ? ?Subjective: ? ?Doing well; comfortable with epidural ?Objective: ?Vitals:  ? 11/19/21 0900 11/19/21 0930 11/19/21 1000 11/19/21 1030  ?BP: 110/67 112/73 113/70 (!) 102/56  ?Pulse: 82 85 77 81  ?Resp: '18 16 16 16  '$ ?Temp:   98 ?F (36.7 ?C)   ?TempSrc:   Oral   ?SpO2:      ?Weight:      ?Height:      ? ?No intake/output data recorded. ? ?FHT:  FHR: 140 bpm, variability: moderate,  accelerations:  Present,  decelerations:  Absent ?UC:   irregular, every 2-5 minutes ?SVE:   Dilation: 6 ?Effacement (%): 90 ?Station: -1 ?Exam by:: K koositra CNM ?Pitocin @ 0 mu/min ? ?Labs: ?Lab Results  ?Component Value Date  ? WBC 11.1 (H) 11/18/2021  ? HGB 11.4 (L) 11/18/2021  ? HCT 35.6 (L) 11/18/2021  ? MCV 81.7 11/18/2021  ? PLT 272 11/18/2021  ? ? ?Assessment / Plan: ?Does not want to start pitocin right now, but she did want to get her cervix checked. Cervix is essentially the same, long discussion with patient and patient's mother and FOB about starting pitocin later on today. Patient's mother had many questions about risk to baby with pitocin; I explained the risk v. Benefits and how we manage pitocin. They would like to wait a few more hours before starting pitocin.  ? ?Labor:  early labor ?Fetal Wellbeing:  Category I ?Pain Control:  Epidural ?Anticipated MOD:  NSVD ? ?KMervyn SkeetersKooistra ?11/19/2021, 10:47 AM ? ? ? ? ?

## 2021-11-19 NOTE — Progress Notes (Addendum)
Patient ID: Anne Long, female   DOB: Jan 23, 1986, 36 y.o.   MRN: [id number] ? ?Comfortable w epidural; on R side w peanut ball; has been 6cm since 0730 with Pitocin started at 1845 ? ?BP 105/74, P 90 ?FHR 150s, +accels, no decels, occ variables ?Ctx q 2-4 mins with Pit @ 4m/min, MVUs starting to be adequate ?Cx deferred ? ?IUP'@39'$ .5wks ?Protracted active labor due to inadequate ctx ?ROM approx 19h ? ?-Plan to check cx when MVUs have been adequate for at least 2 hrs, or sooner w increased pressure/urge to push ?-Anticipate vag del ? ?KMyrtis SerCNM ?11/19/2021 ? ?

## 2021-11-19 NOTE — Progress Notes (Signed)
Labor Progress Note ?Anne Long is a 36 y.o. G2P0010 at 38w5dpresented for SOL ?S:  ?The patient has been resting with only mild discomfort. She states she is doing well and is being supported well by her mother and husband. ? ?O:  ?BP (!) 103/59   Pulse 100   Temp 98.6 ?F (37 ?C) (Oral)   Resp 18   Ht '4\' 11"'$  (1.499 m)   Wt 71.9 kg   LMP 02/14/2021   SpO2 97%   BMI 32.03 kg/m?  ?EFM: Baseline 150 bpm/Moderate variability/+accels/+variable decel ? ?CVE: Dilation: 5 ?Effacement (%): 100 ?Station: -1 ?Presentation: Vertex ?Exam by:: Dr. HTona Sensing? ? ?A&P: 36y.o. G2P0010 382w5dresenting for SOL ?#Labor: Progressing well. S/P SROM with moderate meconium release observed. Expectant management; will consider augmentation with pitocin if contractions space out further. Continue monitoring and reassess in 4 hours. ?#Pain: Epidural placed ?#FWB: Category 2 d/t variable decel with good recovery, improved with position change, will continue to monitor ?#GBS negative ? ? ?Dayne Chait ShMining engineerMedical Student ?3:07 AM  ?

## 2021-11-19 NOTE — Progress Notes (Signed)
Patient ID: Anne Long, female   DOB: 24-Mar-1986, 36 y.o.   MRN: [id number] ? ?Feeling more rectal pressure ? ?FHR 150s, +accels, occ mi variables ?Ctx q 2-4 mins with Pit @ 63m/min and MVUs adequate x 2h ?Cx 8/90/vtx -2, mod MSF ? ?IUP'@39'$ .5wks ?Active labor/transition ? ?Keep ctx reg/adequate with Pitocin ?Check cx for progress in next 2-3 hrs or sooner w symptoms ?Anticipate vag del ? ?KMyrtis SerCNM ?11/19/2021 ?11:05 PM ? ?

## 2021-11-19 NOTE — Progress Notes (Signed)
Patient Vitals for the past 4 hrs: ? BP Temp Temp src Pulse Resp  ?11/19/21 0800 123/71 -- -- 93 --  ?11/19/21 0736 124/71 98.4 ?F (36.9 ?C) Oral 97 --  ?11/19/21 0730 -- -- -- -- 18  ?11/19/21 0701 (!) 99/53 -- -- 90 --  ?11/19/21 0631 (!) 98/55 -- -- 95 --  ?11/19/21 0601 (!) 101/54 -- -- (!) 101 --  ?11/19/21 0530 (!) 93/51 -- -- 97 18  ?11/19/21 0501 117/73 -- -- (!) 103 18  ?11/19/21 0431 112/68 -- -- 96 --  ? ?Comfortable w/epidrual. FHR Cat 1.  Ctx q 3-5 minutes.  Cx 6/90/-1.  Offered Pitocin, declines for now--wants to wait and recheck in an hour or so.  ?

## 2021-11-19 NOTE — Progress Notes (Signed)
Patient ID: Anne Long, female   DOB: [birth year], 36 y.o.   MRN: [id number] ? ? ? ?Anne Long is a 36 y.o. G2P0010 at [redacted]w[redacted]d admitted for rupture of membranes ? ?Subjective: ?Coping well, comfortable with epidural ? ?Objective: ?Vitals:  ? 11/19/21 1730 11/19/21 1800 11/19/21 1815 11/19/21 1830  ?BP: 115/70 116/83  (!) 92/53  ?Pulse: 85 83  99  ?Resp: '18 18  16  '$ ?Temp:   98.6 ?F (37 ?C)   ?TempSrc:   Oral   ?SpO2:      ?Weight:      ?Height:      ? ?Total I/O ?In: -  ?Out: 350 [Urine:350] ? ?FHT:  FHR: 150 bpm, variability: moderate,  accelerations:  Present,  decelerations:  Absent ?UC:   irregular, every q3 minutes ?SVE:   Dilation: 6 ?Effacement (%): 100 ?Station: -1 ?Exam by:: [name] ? ? ?Labs: ?Lab Results  ?Component Value Date  ? WBC 11.1 (H) 11/18/2021  ? HGB 11.4 (L) 11/18/2021  ? HCT 35.6 (L) 11/18/2021  ? MCV 81.7 11/18/2021  ? PLT 272 11/18/2021  ? ? ?Assessment / Plan: ?Patient now amenable to IUPC but does not want pitocin yet. Again reviewed risk of infection; however, patient would like to know her MVU's before proceeding.  ? ?Labor:  protracted active phase ?Fetal Wellbeing:  Category I ?Pain Control:  Epidural ?Anticipated MOD:  NSVD ? ?KMervyn SkeetersKooistra ?11/19/2021, 6:58 PM ? ? ?  ?

## 2021-11-19 NOTE — Progress Notes (Signed)
Patient ID: Anne Long, female   DOB: 09-04-1986, 36 y.o.   MRN: [id number] ? ? ? ?Anne Long is a 36 y.o. G2P0010 at [redacted]w[redacted]d admitted for PROM ? ?Subjective: ? ?Patient doing well ?Objective: ?Vitals:  ? 11/19/21 1730 11/19/21 1800 11/19/21 1815 11/19/21 1830  ?BP: 115/70 116/83  (!) 92/53  ?Pulse: 85 83  99  ?Resp: '18 18  16  '$ ?Temp:   98.6 ?F (37 ?C)   ?TempSrc:   Oral   ?SpO2:      ?Weight:      ?Height:      ? ?Total I/O ?In: -  ?Out: 350 [Urine:350] ? ?FHT:  140 bpm, mod var, present acel, no decels, ctx are q2-5 irregular ?UC:   irregular, every 2-5 minutes ?SVE:   Dilation: 6 ?Effacement (%): 100 ?Station: -1 ?Exam by:: [name] ? ? ?Labs: ?Lab Results  ?Component Value Date  ? WBC 11.1 (H) 11/18/2021  ? HGB 11.4 (L) 11/18/2021  ? HCT 35.6 (L) 11/18/2021  ? MCV 81.7 11/18/2021  ? PLT 272 11/18/2021  ? ? ?Assessment / Plan: ?Protracted active phase ? ?Labor:  slow progress up to this point. Patient was amenable to starting pitocin at 9 am but has preferred to wait until now. After long discussion, patient wants to continue to wait to start pitocin.  I discussed risk v. Benefits of pitocin, explained that we don't want her to become infected but that I understand she is nervous about pitocin. Discussed IUPC placement as a precurser to pitocin, and she is amenable to that in one hour. ?Fetal Wellbeing:  Category I ?Pain Control:  Epidural ?Anticipated MOD:  NSVD ? ?KMervyn SkeetersKooistra ?11/19/2021, 6:53 PM ? ? ? ? ?

## 2021-11-19 NOTE — Progress Notes (Signed)
Patient ID: Anne Long, female   DOB: 1986/02/10, 36 y.o.   MRN: [id number] ? ? ?Anne Long is a 36 y.o. G2P0010 at [redacted]w[redacted]d admitted for IOL for PROM.  ? ?Subjective: ?Patient comfortable with epidural ? ?Objective: ?Vitals:  ? 11/19/21 1730 11/19/21 1800 11/19/21 1815 11/19/21 1830  ?BP: 115/70 116/83  (!) 92/53  ?Pulse: 85 83  99  ?Resp: '18 18  16  '$ ?Temp:   98.6 ?F (37 ?C)   ?TempSrc:   Oral   ?SpO2:      ?Weight:      ?Height:      ? ?No intake/output data recorded. ? ?FHT:  FHR: 140 bpm, variability: moderate,  accelerations:  Present,  decelerations:  Absent ?UC:   irregular, every 332mutes ?SVE:   Dilation: 6 ?Effacement (%): 100 ?Station: -1 ?Exam by:: K Eusebio MeNM ?Pitocin @ 0 mu/min ? ?Labs: ?Lab Results  ?Component Value Date  ? WBC 11.1 (H) 11/18/2021  ? HGB 11.4 (L) 11/18/2021  ? HCT 35.6 (L) 11/18/2021  ? MCV 81.7 11/18/2021  ? PLT 272 11/18/2021  ? ? ?Assessment / Plan: ?Patient now agreeable to pitocin; discussed risk v. Benefits and our theory of start low and go slow.  ?Patient wanted to wait until both mother and husband were in the room to discuss this; after long discussion and explanation of MVUs (currently at 130); she is agreeable to pitocin at 2 x 2.  ? ?Labor:  not checked ?Fetal Wellbeing:  Category I ?Pain Control:  Epidural ?Anticipated MOD:  NSVD ? ?KaMervyn Skeetersooistra ?11/19/2021, 7:03 PM ? ? ? ? ?

## 2021-11-20 ENCOUNTER — Encounter (HOSPITAL_COMMUNITY): Payer: Self-pay | Admitting: Obstetrics and Gynecology

## 2021-11-20 DIAGNOSIS — Z3A40 40 weeks gestation of pregnancy: Secondary | ICD-10-CM

## 2021-11-20 DIAGNOSIS — O480 Post-term pregnancy: Secondary | ICD-10-CM

## 2021-11-20 MED ORDER — ONDANSETRON HCL 4 MG/2ML IJ SOLN: 4.0000 mg | INTRAMUSCULAR | Status: DC | PRN

## 2021-11-20 MED ORDER — SENNOSIDES-DOCUSATE SODIUM 8.6-50 MG PO TABS
2.0000 | ORAL_TABLET | Freq: Every day | ORAL | Status: DC
  Administered 2021-11-21 – 2021-11-22 (×2): 2 via ORAL
  Filled 2021-11-20 (×2): qty 2

## 2021-11-20 MED ORDER — ACETAMINOPHEN 500 MG PO TABS
1000.0000 mg | ORAL_TABLET | ORAL | Status: DC | PRN
  Administered 2021-11-20: 1000 mg via ORAL
  Filled 2021-11-20: qty 2

## 2021-11-20 MED ORDER — ONDANSETRON HCL 4 MG PO TABS: 4.0000 mg | ORAL_TABLET | ORAL | Status: DC | PRN

## 2021-11-20 MED ORDER — ACETAMINOPHEN 325 MG PO TABS: 650.0000 mg | ORAL_TABLET | ORAL | Status: DC | PRN

## 2021-11-20 MED ORDER — DIPHENHYDRAMINE HCL 25 MG PO CAPS: 25.0000 mg | ORAL_CAPSULE | Freq: Four times a day (QID) | ORAL | Status: DC | PRN

## 2021-11-20 MED ORDER — BENZOCAINE-MENTHOL 20-0.5 % EX AERO
1.0000 "application " | INHALATION_SPRAY | CUTANEOUS | Status: DC | PRN
  Administered 2021-11-20: 1 via TOPICAL
  Filled 2021-11-20: qty 56

## 2021-11-20 MED ORDER — SIMETHICONE 80 MG PO CHEW: 80.0000 mg | CHEWABLE_TABLET | ORAL | Status: DC | PRN

## 2021-11-20 MED ORDER — WITCH HAZEL-GLYCERIN EX PADS
1.0000 "application " | MEDICATED_PAD | CUTANEOUS | Status: DC | PRN
  Administered 2021-11-20: 1 via TOPICAL

## 2021-11-20 MED ORDER — IBUPROFEN 600 MG PO TABS
600.0000 mg | ORAL_TABLET | Freq: Four times a day (QID) | ORAL | Status: DC
  Administered 2021-11-20 – 2021-11-22 (×7): 600 mg via ORAL
  Filled 2021-11-20 (×7): qty 1

## 2021-11-20 MED ORDER — PRENATAL MULTIVITAMIN CH
1.0000 | ORAL_TABLET | Freq: Every day | ORAL | Status: DC
Start: 2021-11-21 — End: 2021-11-22
  Administered 2021-11-21 – 2021-11-22 (×2): 1 via ORAL
  Filled 2021-11-20 (×2): qty 1

## 2021-11-20 MED ORDER — ZOLPIDEM TARTRATE 5 MG PO TABS: 5.0000 mg | ORAL_TABLET | Freq: Every evening | ORAL | Status: DC | PRN

## 2021-11-20 MED ORDER — CEFAZOLIN SODIUM-DEXTROSE 2-4 GM/100ML-% IV SOLN
2.0000 g | Freq: Once | INTRAVENOUS | Status: AC
  Administered 2021-11-20: 2 g via INTRAVENOUS
  Filled 2021-11-20: qty 100

## 2021-11-20 MED ORDER — LACTATED RINGERS AMNIOINFUSION: INTRAVENOUS | Status: DC

## 2021-11-20 MED ORDER — TETANUS-DIPHTH-ACELL PERTUSSIS 5-2.5-18.5 LF-MCG/0.5 IM SUSY: 0.5000 mL | PREFILLED_SYRINGE | Freq: Once | INTRAMUSCULAR | Status: DC

## 2021-11-20 MED ORDER — DIBUCAINE (PERIANAL) 1 % EX OINT
1.0000 "application " | TOPICAL_OINTMENT | CUTANEOUS | Status: DC | PRN
  Administered 2021-11-20 – 2021-11-22 (×3): 1 via RECTAL
  Filled 2021-11-20 (×2): qty 28

## 2021-11-20 MED ORDER — COCONUT OIL OIL
1.0000 | TOPICAL_OIL | Status: DC | PRN
Start: 2021-11-20 — End: 2021-11-22

## 2021-11-20 NOTE — Progress Notes (Signed)
Dr. Roselie Awkward at bedside to assess cervix. Cervix now 10/100/+1. MD pushed with patient x1 and wants patient to begin pushing. Pitocin restarted at 38m/min.  ? ?CWende Mott CNM ?11/20/21 ?11:56 AM ? ?

## 2021-11-20 NOTE — Lactation Note (Signed)
This note was copied from a baby's chart. ?Lactation Consultation Note ? ?Patient Name: Anne Long ?Today's Date: 11/20/2021 ?Reason for consult: L&D Initial assessment;Primapara;Term ?Age:35 hours ? ?I changed infant's position on Mom's chest to facilitate latch. Infant did briefly show feeding cues, but then fell asleep (excellent tongue extension was noted with the cues). Parents were educated about initial, sleepy newborn behavior. Parents did not have any questions.  ? ?Lactation to f/u on the postpartum floor. ? ?Larkin Ina ?11/20/2021, 2:26 PM ? ? ? ?

## 2021-11-20 NOTE — Progress Notes (Signed)
Patient ID: Anne Long, female   DOB: Jun 10, 1986, 36 y.o.   MRN: [id number] ? ?Feeling some rectal tightening; still changing positions q hour ? ?VSS, afebrile ?FHR 150-155, +accels, occ variables ?Ctx q 2-4 mins with Pit @ 32m/min and MVUs adequate ?Cx 9/90/vtx -1 ? ?IUP'@39'$ .6wks ?Active labor/transition ? ?Plan to check cx at 0400 or sooner w urge to push ?Was hopeful for more fetal descent ?Anticipate vag del ? ?KMyrtis SerCNM ?11/20/2021 ?2:03 AM ? ?

## 2021-11-20 NOTE — Progress Notes (Signed)
CNM notified of elevated axillary temp. Will put in tylenol and IV antibiotics.  ? ?Anne Long, CNM ?11/20/21 ?3:28 PM ? ?

## 2021-11-20 NOTE — Progress Notes (Signed)
Patient ID: Anne Long, female   DOB: 06/20/1986, 36 y.o.   MRN: [id number] ? ?LABOR PROGRESS NOTE ? ?Subjective: Patient is pushing at bs with RN and family at bs for support.  ? ?Objective: ?Blood pressure 126/84, pulse 98, temperature 99.2 ?F (37.3 ?C), temperature source Oral, resp. rate 18, height '4\' 11"'$  (1.499 m), weight 71.9 kg, last menstrual period 02/14/2021, SpO2 97 %. ?Temp (24hrs), Avg:99 ?F (37.2 ?C), Min:98 ?F (36.7 ?C), Max:100.2 ?F (37.9 ?C) ? ? ?Fetal Heart Tones ?Baseline: 155 ?Variability: moderate ?Accelerations: present ?Decelerations: present; variables ?Overall assessment: cat 2 ? ?Contractions: regular; q 4 ? ?Cervical Exam: complete on last SVE; pushing with good maternal efforts ? ?ASSESSMENT AND PLAN:   ?Normally progressing IUP in active labor at [redacted]w[redacted]d Continue pitocin augmentation as tolerated with frequent position changes while pushing. Anticipate SVD. ? ?Electronically Signed By: ?OGreggory Keen SNM ?11/20/21 9:13 AM   ?

## 2021-11-20 NOTE — Progress Notes (Signed)
Labor Progress Note ?Anne Long is a 36 y.o. G2P0010 at 69w6dpresented for spontaneous onset of labor/SROM ? ?S:  ?Patient sleeping upon assessment ? ?O:  ?BP (!) 101/59   Pulse 84   Temp 98.7 ?F (37.1 ?C) (Oral)   Resp 18   Ht '4\' 11"'$  (1.499 m)   Wt 71.9 kg   LMP 02/14/2021   SpO2 97%   BMI 32.03 kg/m?  ? ?Fetal Tracing: ? ?Baseline: 150 ?Variability: moderate ?Accels: none ?Decels: late/variable ? ?Toco: 2-4 ? ? ?CVE: Dilation: 8 ?Dilation Complete Date: 11/20/21 ?Dilation Complete Time: 0412 ?Effacement (%): 90 ?Station: 0 ?Presentation: Vertex ?Exam by:: CSharmon Revere CNM ? ? ?A&P: 36y.o. G2P0010 321w6dpontaneous onset of labor and prolonged SROM ?#Labor: Reviewed FHR tracing with Dr. ArRoselie Awkwardnd dilation. Dr. ArRoselie Awkwardecommends IUPC with amnioinfusion. ? ?Discussed plan of care with patient and family. Patient agreeable to plan of care. IUPC placed without difficulty and amnioinfusion started.  ? ?Will titrate pitocin as needed  ? ?#Pain: epidural ?#FWB: Cat 2, MD aware ?#GBS negative ? ?CaWende MottCNM ?10:51 AM ? ?

## 2021-11-20 NOTE — Progress Notes (Signed)
CNM called to bedside to evaluated cervix by RR RN. RN has concerns that patient is not complete. CNM examined patient and patient found to be 8/90/0. Pushing discontinued and patient changed to hands and knees position. Discussed continuing to augment and rechecking before pushing again. Patient verbalized understanding of plan of care.  ? ?Wende Mott, CNM ?11/20/21 ?9:53 AM ? ?

## 2021-11-20 NOTE — Progress Notes (Signed)
Patient ID: Anne Long, female   DOB: 1985/09/20, 36 y.o.   MRN: [id number] ? ?Pushing well x 1hr now after laboring down just under 2hrs (complete at 412) ? ?T 100.2 at 0456 and now 99.5; other VSS ?FHR 150-155, +accels, avg LTV, occ variables ?Ctx q 2-77mns with Pit @ 114mmin ?Vtx descends with pushing to +1, then goes back to 0 station ? ?IUP'@39'$ .6wks ?End 1st stage ?Prolonged ROM >24hr ? ?Will sit up in throne position for an hour and then resume pushing ?Guarded for vag del ? ?KiMyrtis SerNM ?11/20/2021 ? ? ?

## 2021-11-20 NOTE — Progress Notes (Signed)
Patient ID: Anne Long, female   DOB: 09/29/1985, 36 y.o.   MRN: [id number] ? ?Given recent fluid bolus of 500cc due to decreased urine output; still feeling comfortable ? ?Afebrile, VSS ?FHR 150s, +accels, occ mi variables ?Ctx q 2-4 mins with adequate MVUs and Pit 32m/min ?Cx C/C/vtx 0 ? ?Urine output: approx 30cc in bag right after bolus ? ?IUP'@39'$ .6wks ?End 1st stage ?Decreased urine output ?ROM >24h ? ?Will give another 500cc fluid bolus ?Labor down 1.5hrs, or start pushing sooner w urge ?Hopeful for vag del ? ?KMyrtis SerCNM ?11/20/2021 ? ?

## 2021-11-20 NOTE — Discharge Summary (Signed)
?  Postpartum Discharge Summary ?   ?Patient Name: Anne Long ?DOB: 1985/10/11 ?MRN: [id number] ? ?Date of admission: 11/18/2021 ?Delivery date:11/20/2021  ?Delivering provider: Woodroe Long  ?Date of discharge: 11/22/2021 ? ?Admitting diagnosis: Post term pregnancy over 40 weeks [O48.0] ?Indication for care in labor or delivery [O75.9] ?Intrauterine pregnancy: [redacted]w[redacted]d     ?Secondary diagnosis:  Active Problems: ?  Advanced maternal age, antepartum ?  Alpha thalassemia silent carrier ?  Uterine fibroid complicating antenatal care ?  Indication for care in labor or delivery ?  Shoulder dystocia, delivered, current hospitalization ? ?Additional problems: Concern for post partum endometritis (Ancef x 1) No fever after this.   ?Discharge diagnosis: Term Pregnancy Delivered                                              ?Post partum procedures:None ?Augmentation: AROM, Pitocin, Cytotec, and IP Foley ?Complications: None ? ?Hospital course: Onset of Labor With Vaginal Delivery      ?36 y.o. yo G2P1011 at [redacted]w[redacted]d was admitted in Latent Labor on 11/18/2021. Patient had an uncomplicated labor course as follows:  ?Membrane Rupture Time/Date: 2:48 AM ,11/19/2021   ?Delivery Method:Vaginal, Spontaneous  ?Episiotomy: None  ?Lacerations:  None  ?Patient had an uncomplicated postpartum course.  She is ambulating, tolerating a regular diet, passing flatus, and urinating well. Patient is discharged home in stable condition on 11/22/21. ? ?Newborn Data: ?Birth date:11/20/2021  ?Birth time:1:39 PM  ?Gender:Female  ?Living status:Living  ?Apgars:4 ,8  ?Weight:3570 g  ? ?Magnesium Sulfate received: No ?BMZ received: No ?Rhophylac:N/A ?MMR:N/A ?T-DaP:Given prenatally ?Flu: Yes ?Transfusion:No ? ?Physical exam  ?Vitals:  ? 11/21/21 1222 11/21/21 1619 11/21/21 2032 11/22/21 0520  ?BP: 108/63 113/64 117/71 115/78  ?Pulse: 97 96 79 79  ?Resp: $Remov'18 17 16 18  'MSiVMz$ ?Temp: 97.9 ?F (36.6 ?C) 97.9 ?F (36.6 ?C) 98.9 ?F (37.2 ?C) 97.6 ?F (36.4 ?C)  ?TempSrc: Oral  Oral Oral Oral  ?SpO2: 100% 100% 100% 100%  ?Weight:      ?Height:      ? ?General: alert, cooperative, and no distress ?Lochia: appropriate ?Uterine Fundus: firm ?Incision: N/A ?DVT Evaluation: No evidence of DVT seen on physical exam. ?No significant calf/ankle edema. ?Labs: ?Lab Results  ?Component Value Date  ? WBC 19.1 (H) 11/21/2021  ? HGB 9.8 (L) 11/21/2021  ? HCT 29.5 (L) 11/21/2021  ? MCV 79.9 (L) 11/21/2021  ? PLT 202 11/21/2021  ? ?CMP Latest Ref Rng & Units 04/27/2021  ?Glucose 65 - 99 mg/dL 81  ?BUN 6 - 20 mg/dL 7  ?Creatinine 0.57 - 1.00 mg/dL 0.68  ?Sodium 134 - 144 mmol/L 134  ?Potassium 3.5 - 5.2 mmol/L 4.5  ?Chloride 96 - 106 mmol/L 96  ?CO2 20 - 29 mmol/L 23  ?Calcium 8.7 - 10.2 mg/dL 9.7  ?Total Protein 6.0 - 8.5 g/dL 6.9  ?Total Bilirubin 0.0 - 1.2 mg/dL 0.3  ?Alkaline Phos 44 - 121 IU/L 56  ?AST 0 - 40 IU/L 12  ?ALT 0 - 32 IU/L 10  ? ?Edinburgh Score: ?No flowsheet data found. ? ? ?After visit meds:  ?Allergies as of 11/22/2021   ? ?   Reactions  ? Other Swelling  ? Shrimp  ? ?  ? ?  ?Medication List  ?  ? ?STOP taking these medications   ? ?Breast Pump Misc ?  ?cefadroxil 500  MG capsule ?Commonly known as: DURICEF ?  ?Iron Polysacch Cmplx-B12-FA 150-0.025-1 MG Caps ?  ?Misc. Devices Misc ?  ? ?  ? ?TAKE these medications   ? ?acetaminophen 325 MG tablet ?Commonly known as: Tylenol ?Take 2 tablets (650 mg total) by mouth every 4 (four) hours as needed (for pain scale < 4). ?  ?ferrous sulfate 325 (65 FE) MG tablet ?Take 1 tablet (325 mg total) by mouth every other day. ?Start taking on: November 23, 2021 ?  ?ibuprofen 600 MG tablet ?Commonly known as: ADVIL ?Take 1 tablet (600 mg total) by mouth every 6 (six) hours. ?  ?PRENATAL VITAMINS PO ?Take by mouth. ?  ? ?  ? ? ? ?Discharge home in stable condition ?Infant Feeding: Breast ?Infant Disposition:home with mother ?Discharge instruction: per After Visit Summary and Postpartum booklet. ?Activity: Advance as tolerated. Pelvic rest for 6 weeks.   ?Diet: routine diet ?Future Appointments: ?Future Appointments  ?Date Time Provider [place]  ?12/28/2021 11:15 AM Anyanwu, Sallyanne Havers, MD [facility]  ? ?Follow up Visit: ? ? ?Please schedule this patient for a In person postpartum visit in 4 weeks with the following provider: Any provider. ?Additional Postpartum F/U: n/a   ?Low risk pregnancy complicated by:  shoulder dystocia ?Delivery Long:  Vaginal, Spontaneous  ?Anticipated Birth Control:  Unsure ? ? ?11/22/2021 ?Anne Heck, MD ? ? ? ?

## 2021-11-21 ENCOUNTER — Inpatient Hospital Stay (HOSPITAL_COMMUNITY): Payer: BC Managed Care – PPO

## 2021-11-21 LAB — CBC
HCT: 29.5 % — ABNORMAL LOW (ref 36.0–46.0)
Hemoglobin: 9.8 g/dL — ABNORMAL LOW (ref 12.0–15.0)
MCH: 26.6 pg (ref 26.0–34.0)
MCHC: 33.2 g/dL (ref 30.0–36.0)
MCV: 79.9 fL — ABNORMAL LOW (ref 80.0–100.0)
Platelets: 202 10*3/uL (ref 150–400)
RBC: 3.69 MIL/uL — ABNORMAL LOW (ref 3.87–5.11)
RDW: 15.6 % — ABNORMAL HIGH (ref 11.5–15.5)
WBC: 19.1 10*3/uL — ABNORMAL HIGH (ref 4.0–10.5)
nRBC: 0 % (ref 0.0–0.2)

## 2021-11-21 MED ORDER — FERROUS SULFATE 325 (65 FE) MG PO TABS
325.0000 mg | ORAL_TABLET | ORAL | Status: DC
  Administered 2021-11-21: 325 mg via ORAL
  Filled 2021-11-21: qty 1

## 2021-11-21 NOTE — Lactation Note (Signed)
This note was copied from a baby's chart. ?Lactation Consultation Note ? ?Patient Name: Anne Long ?Today's Date: 11/21/2021 ?Reason for consult: Initial assessment;Primapara;1st time breastfeeding;Term ?Age:35 hours ? ?LC in to visit with P62 Mom of term baby.  Baby was a Neonatal Code Blue, shoulder dystocia for 2 mins, MSF and PROM.  NICU team called. ? ?Baby has been sleepy at the breast with one feeding on and off recorded.  Baby has had 5 supplemental feedings of donor breast milk due to borderline low temps and low CBGs.   ? ?[name] reassured Mom that we would support her breastfeeding with breast massage, hand expression and double pumping using the Medela Symphony DEBP.   ? ?[name] set up DEBP and assisted Mom with first pumping, 27 mm flanges appear to be a good size currently.  Mom to use initiation setting and pump with every supplement feeding baby receives.   ? ?Once baby returns, encouraged STS with Mom and offering the breast with feeding cues.  Mom will pump if baby too sleepy to feed at the breast.   ? ?Mom encouraged to call prn for assistance.  RN aware. ? ?Maternal Data ?Has patient been taught Hand Expression?: Yes ?Does the patient have breastfeeding experience prior to this delivery?: No ? ?Feeding ?Mother's Current Feeding Choice: Breast Milk ?Nipple Type: Slow - flow ? ?Lactation Tools Discussed/Used ?Tools: Pump;Flanges;Bottle ?Flange Size: 27 ?Breast pump type: Double-Electric Breast Pump ?Pump Education: Setup, frequency, and cleaning;Milk Storage ?Reason for Pumping: Support milk supply/infant supplemented due to low blood sugars and low temps ?Pumping frequency: Encouraged Mom to pump both breasts whenever baby is supplemented ? ?Interventions ?Interventions: Breast feeding basics reviewed;Skin to skin;Breast massage;Hand express;DEBP;LC Services brochure ? ?Discharge ?Pump: Personal [name] and [name]) ? ?Consult Status ?Consult Status: Follow-up ?Date: 11/22/21 ?Follow-up type:  In-patient ? ? ? ?Tilda Burrow E ?11/21/2021, 8:30 AM ? ? ? ?

## 2021-11-21 NOTE — Lactation Note (Signed)
This note was copied from a baby's chart. ?Lactation Consultation Note ?Baby has been in and out of SN under warmer and needing to receive formula. ? ?Patient Name: Anne Long ?Today's Date: 11/21/2021 ?  ?Age:36 hours ? ?Maternal Data ?  ? ?Feeding ?Nipple Type: Slow - flow ? ?LATCH Score ?  ? ?  ? ?  ? ?  ? ?  ? ?  ? ? ?Lactation Tools Discussed/Used ?  ? ?Interventions ?  ? ?Discharge ?  ? ?Consult Status ?  ? ? ? ?Theodoro Kalata ?11/21/2021, 4:14 AM ? ? ? ?

## 2021-11-21 NOTE — Lactation Note (Signed)
This note was copied from a baby's chart. ? ?NICU Lactation Consultation Note ? ?Patient Name: Anne Long ?Today's Date: 11/21/2021 ?Age:35 hours ? ? ?Subjective ?Reason for consult: NICU baby; Follow-up assessment; Other (Comment) (transfer to NICU from [facility]) ?Mother and baby now in couplet care. Mother was pumping when I arrived in room. We reviewed pumping ed. Mother has pump for at-home use.  ? ?Objective ?Infant data: ?Mother's Current Feeding Choice: Breast Milk and Donor Milk ? ?Infant feeding assessment ?Scale for Readiness: 1 ?Scale for Quality: 5 (infant uncoordinated and pushing nipple out of mouth.) ? ?Maternal data: ?[id number]  ?Vaginal, Spontaneous ? ?Does the patient have breastfeeding experience prior to this delivery?: No ? ?Pumping frequency: encouraged to pump q3 ?Flange Size: 27 ? ? ?WIC Referral Sent?: No ?Pump: Personal Chartered certified accountant) ? ?Assessment ?Infant: ?LATCH Score: 7 ? ?No data recorded ? ?Maternal: ?No data recorded ? ?Intervention/Plan ?Interventions: Education ? ?Tools: Pump ?Pump Education: Setup, frequency, and cleaning; Milk Storage ? ?Plan: ?Consult Status: Follow-up ? ?NICU Follow-up type: New admission follow up; Maternal D/C visit; Verify onset of copious milk; Verify absence of engorgement ? ?Mother to pump q3 for 15 minutes. ? ?Gwynne Edinger ?11/21/2021, 5:03 PM ?

## 2021-11-21 NOTE — Progress Notes (Signed)
POSTPARTUM PROGRESS NOTE ? ?Subjective: Anne Long is a 36 y.o. G2P1011 PPD#1 s/p SVD at [redacted]w[redacted]d  She reports she doing well. No acute events overnight. She denies any problems with ambulating, voiding or po intake. Denies nausea or vomiting. She has  passed flatus. Pain is well controlled.  Lochia is scant. ? ?Patient had postpartum fever (one hour post delivery) that was treated with IV ancef and patient has been afebrile since 1454 yesterday. ? ?Objective: ?Blood pressure 107/71, pulse 79, temperature 98 ?F (36.7 ?C), temperature source Oral, resp. rate 18, height '4\' 11"'$  (1.499 m), weight 71.9 kg, last menstrual period 02/14/2021, SpO2 100 %, unknown if currently breastfeeding. ? ?Physical Exam:  ?General: alert, cooperative and no distress ?Chest: no respiratory distress ?Abdomen: soft, non-tender  ?Uterine Fundus: firm, appropriately tender ?Extremities: No calf swelling or tenderness  no edema ? ?Recent Labs  ?  11/18/21 ?2134 11/21/21 ?0409  ?HGB 11.4* 9.8*  ?HCT 35.6* 29.5*  ? ? ?Assessment/Plan: ?Anne Plouffeis a 36y.o. G2P1011  PPD#1 s/p SVD at 341w6d? ?Routine Postpartum Care: Doing well, pain well-controlled.  ?-- Continue routine care, lactation support  ?-- Contraception: unsure ?-- Feeding: breast ? ?#Endometritis ?Patient received 1 dose of IV ancef. Afebrile since initial fever and now afebrile for >12 hours. Will continue to monitor today. ?- If has another fever plan for Clindamycin and Gent for 24 hours after last fever. ? ?#Acute blood loss anemia ?HgB 11.4>9.8. Plan for PO iron ? ?Dispo: Plan for discharge PPD#2. Will monitor for endometritis and as baby needs to stay for monitoring for temperature regulation. ? ?Anne MatterMD, MPH ?OB Fellow, Faculty Practice ?[facility]? ?

## 2021-11-21 NOTE — Anesthesia Postprocedure Evaluation (Signed)
Anesthesia Post Note ? ?Patient: Anne Long ? ?Procedure(s) Performed: AN AD HOC LABOR EPIDURAL ? ?  ? ?Patient location during evaluation: Mother Baby ?Anesthesia Type: Epidural ?Level of consciousness: awake ?Pain management: satisfactory to patient ?Vital Signs Assessment: post-procedure vital signs reviewed and stable ?Respiratory status: spontaneous breathing ?Cardiovascular status: stable ?Anesthetic complications: no ? ? ?No notable events documented. ? ?Last Vitals:  ?Vitals:  ? 11/21/21 0400 11/21/21 0622  ?BP: 107/64 107/71  ?Pulse:  79  ?Resp: 18 18  ?Temp: 36.4 ?C 36.7 ?C  ?SpO2:    ?  ?Last Pain:  ?Vitals:  ? 11/21/21 0622  ?TempSrc: Oral  ?PainSc: 0-No pain  ? ?Pain Goal: Patients Stated Pain Goal: 0 (11/18/21 2017) ? ?  ?  ?  ?  ?  ?  ?  ? ?Hawkin Charo ? ? ? ? ?

## 2021-11-22 ENCOUNTER — Ambulatory Visit: Payer: Self-pay

## 2021-11-22 MED ORDER — FERROUS SULFATE 325 (65 FE) MG PO TABS: 325.0000 mg | ORAL_TABLET | ORAL | 0 refills | Status: DC

## 2021-11-22 MED ORDER — IBUPROFEN 600 MG PO TABS
600.0000 mg | ORAL_TABLET | Freq: Four times a day (QID) | ORAL | 0 refills | Status: DC
Start: 2021-11-22 — End: 2021-12-28

## 2021-11-22 MED ORDER — ACETAMINOPHEN 325 MG PO TABS: 650.0000 mg | ORAL_TABLET | ORAL | 0 refills | Status: AC | PRN

## 2021-11-22 NOTE — Lactation Note (Addendum)
This note was copied from a baby's chart. ? ?NICU Lactation Consultation Note ? ?Patient Name: Anne Long ?Today's Date: 11/22/2021 ?Age:36 years ? ? ?Subjective ?Reason for consult: Follow-up assessment; Primapara; 1st time breastfeeding; NICU baby; Term ? ?Lactation followed up with Ms. Furber. She states that she has been pumping consistently. She missed on session due to activity with baby in the room. She is obtaining droplets of colostrum. ? ?We discussed her feeding goals; mom does wish to breast feed. I offered to return at 1430 to assist with latching baby Kayla. ? ?SLP visited at 0900 this morning. Ms. Mauney states that baby took 10 mls with SLP by bottle.  ? ? ?Objective ?Infant data: ?Mother's Current Feeding Choice: Breast Milk and Donor Milk ? ?Infant feeding assessment ?Scale for Readiness: 1 ?Scale for Quality: 3 ? ?  ?Maternal data: ?[id number]  ?Vaginal, Spontaneous ? ?Current breast feeding challenges:: NICU; poor feedings ? ? ?Does the patient have breastfeeding experience prior to this delivery?: No ? ?Pumping frequency: recommended q3 hours ?Pumped volume: 0 mL (several mls) ?Flange Size: 27 ? ?No data recorded ? ?WIC Referral Sent?: No ?Pump: DEBP, Personal ? ?Assessment ? ?Maternal: ? ? ?Intervention/Plan ?Interventions: Breast feeding basics reviewed; Education ? ?Tools: Pump ?Pump Education: Setup, frequency, and cleaning; Milk Storage ? ?Plan: ?Consult Status: Follow-up ? ?NICU Follow-up type: New admission follow up; Assist with IDF-2 (Mother does not need to pre-pump before breastfeeding) ? ? ? ?Lenore Manner ?11/22/2021, 10:32 AM ?

## 2021-11-22 NOTE — Progress Notes (Signed)
Patient screened out for psychosocial assessment since none of the following apply:  Psychosocial stressors documented in mother or baby's chart  Gestation less than 32 weeks  Code at delivery   Infant with anomalies Please contact the Clinical Social Worker if specific needs arise, by MOB's request, or if MOB scores greater than 9/yes to question 10 on Edinburgh Postpartum Depression Screen.  Laurel Harnden, LCSW Clinical Social Worker [facility] Cell#: [phone]     

## 2021-11-22 NOTE — Lactation Note (Addendum)
This note was copied from a baby's chart. ? ?NICU Lactation Consultation Note ? ?Patient Name: Anne Long ?Today's Date: 11/22/2021 ?Age:35 hours ? ? ?Subjective ?Reason for consult: Follow-up assessment; Mother's request; Primapara; NICU baby; 1st time breastfeeding ? ?Lactation assisted with latching baby Kayla to the left breast in cross cradle holed. Ms. Akerson held her breast in a "U" hold, and with assistance, baby latched. Ms. Huberty was able to repeat back well and latch baby independently. Baby had some rhythmic suckling sequences, and after several minutes, she became fatigued. I showed Ms. Kogler how to finger feed her EBM to baby. ? ?I praised Ms. Terry for her technique. I advised her to call lactation when ready for follow up. I encouraged her to continue to breast feed Kayla to allow her to work on her skills. ? ?Ms. Petrey appears to have transitional milk. I provided recommendations on how to treat and prevent engorgement. ? ?Objective ?Infant data: ?Mother's Current Feeding Choice: Breast Milk and Donor Milk ? ?Infant feeding assessment ?Scale for Readiness: 2 ?Scale for Quality: 3 ? ? ?  ?Maternal data: ?[id number]  ?Vaginal, Spontaneous ? ?Current breast feeding challenges:: NICU; poor feedings ? ? ?Does the patient have breastfeeding experience prior to this delivery?: No ? ?Pumping frequency: recommended q3 hours ?Pumped volume: 3 mL ?Flange Size: 27 ? ? ?WIC Referral Sent?: No ?Pump: DEBP, Personal ? ?Assessment ?Infant: ?LATCH Score: 7 ? ? ?Maternal: ?Milk volume: Normal ? ? ?Intervention/Plan ?Interventions: Breast feeding basics reviewed; Assisted with latch; Hand express; Breast compression; Adjust position; Support pillows; Education ? ?Tools: Pump ?Pump Education: Setup, frequency, and cleaning; Milk Storage ? ?Plan: ?Consult Status: Follow-up ? ?NICU Follow-up type: Weekly NICU follow up ? ? ? ?Lenore Manner ?11/22/2021, 3:08 PM ?

## 2021-11-22 NOTE — Discharge Summary (Signed)
? ?  Postpartum Discharge Summary ? ?   ?Patient Name: Anne Long ?DOB: October 05, 1985 ?MRN: [id number] ? ?Date of admission: 11/18/2021 ?Delivery date:11/20/2021  ?Delivering provider: Woodroe Mode  ?Date of discharge: 11/22/2021 ? ?Admitting diagnosis: Post term pregnancy over 40 weeks [O48.0] ?Indication for care in labor or delivery [O75.9] ?Intrauterine pregnancy: [redacted]w[redacted]d    ?Secondary diagnosis:  Active Problems: ?  Advanced maternal age, antepartum ?  Alpha thalassemia silent carrier ?  Uterine fibroid complicating antenatal care ?  Indication for care in labor or delivery ?  Shoulder dystocia, delivered, current hospitalization ? ?Additional problems: none    ?Discharge diagnosis: Term Pregnancy Delivered                                              ?Post partum procedures: n/a ?Augmentation: AROM ?Complications: None ? ?Hospital course: Onset of Labor With Vaginal Delivery      ?36y.o. yo G2P1011 at 339w6das admitted in Active Labor on 11/18/2021. Patient had an uncomplicated labor course as follows:  ?Membrane Rupture Time/Date: 2:48 AM ,11/19/2021   ?Delivery Method:Vaginal, Spontaneous  ?Episiotomy: None  ?Lacerations:  None  ?Patient had an uncomplicated postpartum course.  She is ambulating, tolerating a regular diet, passing flatus, and urinating well. Patient is discharged home in stable condition on 11/22/21. ? ?Newborn Data: ?Birth date:11/20/2020  ?Birth time:1:39 PM  ?Gender:Female  ?Living status:Living  ?Apgars:4 ,8  ?Weight:3570 g  ? ?Magnesium Sulfate received: No ?BMZ received: No ?Rhophylac:N/A ?MMR:N/A ?T-DaP:Given prenatally ?Flu: N/A ?Transfusion:No ? ?Physical exam  ?Vitals:  ? 11/21/21 1222 11/21/21 1619 11/21/21 2032 11/22/21 0520  ?BP: 108/63 113/64 117/71 115/78  ?Pulse: 97 96 79 79  ?Resp: _0 ?Temp: 97.9 ?F (36.6 ?C) 97.9 ?F (36.6 ?C) 98.9 ?F (37.2 ?C) 97.6 ?F (36.4 ?C)  ?TempSrc: Oral Oral Oral Oral  ?SpO2: 100% 100% 100% 100%  ?Weight:      ?Height:      ? ?General: alert,  cooperative, and no distress ?Lochia: appropriate ?Uterine Fundus: firm ?Incision: Healing well with no significant drainage ?DVT Evaluation: No evidence of DVT seen on physical exam. ?Labs: ?Lab Results  ?Component Value Date  ? WBC 19.1 (H) 11/21/2021  ? HGB 9.8 (L) 11/21/2021  ? HCT 29.5 (L) 11/21/2021  ? MCV 79.9 (L) 11/21/2021  ? PLT 202 11/21/2021  ? ?CMP Latest Ref Rng & Units 04/27/2021  ?Glucose 65 - 99 mg/dL 81  ?BUN 6 - 20 mg/dL 7  ?Creatinine 0.57 - 1.00 mg/dL 0.68  ?Sodium 134 - 144 mmol/L 134  ?Potassium 3.5 - 5.2 mmol/L 4.5  ?Chloride 96 - 106 mmol/L 96  ?CO2 20 - 29 mmol/L 23  ?Calcium 8.7 - 10.2 mg/dL 9.7  ?Total Protein 6.0 - 8.5 g/dL 6.9  ?Total Bilirubin 0.0 - 1.2 mg/dL 0.3  ?Alkaline Phos 44 - 121 IU/L 56  ?AST 0 - 40 IU/L 12  ?ALT 0 - 32 IU/L 10  ? ?Edinburgh Score: ?No flowsheet data found. ? ? ?After visit meds:  ?Allergies as of 11/22/2021   ? ?   Reactions  ? Other Swelling  ? Shrimp  ? ?  ? ?  ?Medication List  ?  ? ?STOP taking these medications   ? ?Breast Pump Misc ?  ?cefadroxil 500 MG capsule ?Commonly known as: DURICEF ?  ?Iron Polysacch Cmplx-B12-FA  150-0.025-1 MG Caps ?  ?Misc. Devices Misc ?  ? ?  ? ?TAKE these medications   ? ?acetaminophen 325 MG tablet ?Commonly known as: Tylenol ?Take 2 tablets (650 mg total) by mouth every 4 (four) hours as needed (for pain scale < 4). ?  ?ferrous sulfate 325 (65 FE) MG tablet ?Take 1 tablet (325 mg total) by mouth every other day. ?Start taking on: November 23, 2021 ?  ?ibuprofen 600 MG tablet ?Commonly known as: ADVIL ?Take 1 tablet (600 mg total) by mouth every 6 (six) hours. ?  ?PRENATAL VITAMINS PO ?Take by mouth. ?  ? ?  ? ? ? ?Discharge home in stable condition ?Infant Feeding: Breast ?Infant Disposition:NICU ?Discharge instruction: per After Visit Summary and Postpartum booklet. ?Activity: Advance as tolerated. Pelvic rest for 6 weeks.  ?Diet: routine diet ?Future Appointments: ?Future Appointments  ?Date Time Provider Funk   ?12/28/2021 11:15 AM Anyanwu, Sallyanne Havers, MD [facility]  ? ?Follow up Visit: ? Follow-up Information   ? ? [facility] [employer] at [facility]. Schedule an appointment as soon as possible for a visit in 4 week(s).   ?Specialty: Obstetrics and Gynecology ?Contact information: ?[address] ?[place] ?[phone] ? ?  ?  ? ?  ?  ? ?  ? ? ? ?11/22/2021 ?Truett Mainland, DO ? ? ? ?

## 2021-11-23 ENCOUNTER — Ambulatory Visit: Payer: Self-pay

## 2021-11-23 NOTE — Lactation Note (Addendum)
This note was copied from a baby's chart. ? ?NICU Lactation Consultation Note ? ?Patient Name: Anne Long ?Today's Date: 11/23/2021 ?Age:35 hours ? ? ?Subjective ?Reason for consult: Follow-up assessment; 1st time breastfeeding; NICU baby; Term ? ?Mother seen in NICU for follow-up assessment. She had just finished pumping and reports of increased milk supply, but no breast pain or discomfort. Discussed differences between pumps (e.g. DEBP vs Hands-free) and mother expressed understanding. Plans to continue to use DEBP while in NICU. Mother staying in NICU 24hrs/day and reports that she is pumping each time that the baby is fed via bottle. Mother plans to feed baby at the breast, but is feeding by bottle at this time to monitor volume. Mother had questions regarding pump cleaning and [employer] student re-educated on pump part disassembly and cleaning. Encouraged STS as appropriate depending on infant's condition. Provided mother with pumping top (L).  ? ?Objective ?Infant data: ?Mother's Current Feeding Choice: Breast Milk and Donor Milk ? ?Infant feeding assessment ?Scale for Readiness: 2 ?Scale for Quality: 2 ? ?  ?Maternal data: ?[id number]  ?Vaginal, Spontaneous ?No data recorded ?Current breast feeding challenges:: NICU; poor feedings ? ?No data recorded ?Does the patient have breastfeeding experience prior to this delivery?: No ? ?Pumping frequency: q3 ?Pumped volume: 13 mL ?Flange Size: 27 ? ?No data recorded ? ?WIC Referral Sent?: No ?Pump: DEBP, Personal (Motif Luna, Mom Cozy) ? ?Assessment ? ? ?Maternal: ?Milk volume: Normal ? ? ?Intervention/Plan ?Interventions: Breast feeding basics reviewed; Skin to skin; DEBP; Education ? ?Tools: Pump; Hands-free pumping top (Large) ?Pump Education: Setup, frequency, and cleaning; Other (comment) (DEBP vs Hands-free) ? ?Plan: ? ?Continue to pump q3 or 8x/24hrs.  ?Continue to educate on IDF algorithm and support feeding goals.  ? ?Consult Status: Follow-up ? ?NICU  Follow-up type: Verify onset of copious milk; Verify absence of engorgement ? ? ? ?Kely Dohn-Pervall ?11/23/2021, 10:34 AM ?

## 2021-11-24 ENCOUNTER — Other Ambulatory Visit: Payer: BC Managed Care – PPO

## 2021-11-24 ENCOUNTER — Ambulatory Visit: Payer: Self-pay

## 2021-11-24 ENCOUNTER — Encounter: Payer: BC Managed Care – PPO | Admitting: Family Medicine

## 2021-11-24 NOTE — Lactation Note (Addendum)
This note was copied from a baby's chart. ?Lactation Consultation Note ? ?Patient Name: Anne Long ?Today's Date: 11/24/2021 ?Reason for consult: Follow-up assessment;Primapara;Term;1st time breastfeeding;NICU baby;Other (Comment);Mother's request;Breastfeeding assistance;MD order (AMA) ?Age:35 days ? ? ?P1 mom who wanted assistance with latch and had pumping questions.  [employer] student assisted mom in attempting to latch infant in cross cradle position on the right breast.  Several attempts made and infant would not maintain latch.  Infant was repositioned in football hold on the right side and still would not maintain latch.  Infant became fussy and was placed STS with mom.  Mom educated to keep offering the breast when [name] shows hunger cues.   ? ?Mom is currently pumping 8 times in 24 hours.  Mom is producing 20 mls per session.  Educated mom on switching from initiation setting to expression mode on the pump.  We also discussed pumping after Kayla breastfeeds to ensure adequate milk removal.   ? ?All questions and concerns answered prior to leaving the room.  Mom is aware to call [facility] for assistance.   ? ?Feeding plan: ? Continue to pump every 3 hours for a total of 8 pumping sessions in 24 hours.   ?Pump in expression mode for 15-20 minutes ?Continue to offer the breast when [name] shows hunger cues, and pump after ?Continue STS ? ?Feeding ?Mother's Current Feeding Choice: Breast Milk and Donor Milk ?Nipple Type: Nfant Extra Slow Flow (gold) ? ?LATCH Score ?Latch: Too sleepy or reluctant, no latch achieved, no sucking elicited. (Infant alert, awake, cueing, but fussy and unable to maintain latch) ? ?Audible Swallowing: None ? ?Type of Nipple: Everted at rest and after stimulation ? ?Comfort (Breast/Nipple): Soft / non-tender ? ?Hold (Positioning): Assistance needed to correctly position infant at breast and maintain latch. ? ?LATCH Score: 5 ? ? ?Lactation Tools Discussed/Used ?Tools: Pump ?Breast pump type:  Double-Electric Breast Pump ?Pump Education: Setup, frequency, and cleaning ?Reason for Pumping: Infant separation, NICU baby ?Pumping frequency: every 3 hours ?Pumped volume: 20 mL ? ?Interventions ?Interventions: Breast feeding basics reviewed;Education;Assisted with latch;Position options;Skin to skin;Hand express;Breast massage;DEBP;Breast compression ? ?Discharge ?Pump: DEBP;Personal (Motif Luna and mom cozy) ? ?Consult Status ?Consult Status: Follow-up ?Date: 11/24/21 ?Follow-up type: In-patient ? ? ? ?[name] ?11/24/2021, 1:01 PM ? ? ? ?

## 2021-11-25 ENCOUNTER — Emergency Department: Payer: BC Managed Care – PPO

## 2021-11-25 ENCOUNTER — Other Ambulatory Visit: Payer: Self-pay

## 2021-11-25 ENCOUNTER — Emergency Department
Admission: EM | Admit: 2021-11-25 | Discharge: 2021-11-26 | Disposition: A | Discharge status: Home / Self Care | Payer: BC Managed Care – PPO | Attending: Emergency Medicine | Admitting: Emergency Medicine

## 2021-11-25 DIAGNOSIS — N390 Urinary tract infection, site not specified: Secondary | ICD-10-CM | POA: Insufficient documentation

## 2021-11-25 DIAGNOSIS — R600 Localized edema: Secondary | ICD-10-CM | POA: Insufficient documentation

## 2021-11-25 DIAGNOSIS — R609 Edema, unspecified: Secondary | ICD-10-CM

## 2021-11-25 DIAGNOSIS — R300 Dysuria: Secondary | ICD-10-CM | POA: Diagnosis present

## 2021-11-25 DIAGNOSIS — M7989 Other specified soft tissue disorders: Secondary | ICD-10-CM | POA: Insufficient documentation

## 2021-11-25 LAB — COMPREHENSIVE METABOLIC PANEL
ALT: 34 U/L (ref 0–44)
AST: 26 U/L (ref 15–41)
Albumin: 2.6 g/dL — ABNORMAL LOW (ref 3.5–5.0)
Alkaline Phosphatase: 168 U/L — ABNORMAL HIGH (ref 38–126)
Anion gap: 7 (ref 5–15)
BUN: 10 mg/dL (ref 6–20)
CO2: 27 mmol/L (ref 22–32)
Calcium: 8.2 mg/dL — ABNORMAL LOW (ref 8.9–10.3)
Chloride: 104 mmol/L (ref 98–111)
Creatinine, Ser: 0.82 mg/dL (ref 0.44–1.00)
GFR, Estimated: >60 mL/min (ref >60)
Glucose, Bld: 84 mg/dL (ref 70–99)
Potassium: 3.7 mmol/L (ref 3.5–5.1)
Sodium: 138 mmol/L (ref 135–145)
Total Bilirubin: 0.5 mg/dL (ref 0.3–1.2)
Total Protein: 6.3 g/dL — ABNORMAL LOW (ref 6.5–8.1)

## 2021-11-25 LAB — URINALYSIS, ROUTINE W REFLEX MICROSCOPIC
Bilirubin Urine: NEGATIVE
Glucose, UA: NEGATIVE mg/dL
Ketones, ur: NEGATIVE mg/dL
Nitrite: POSITIVE — AB
Protein, ur: 30 mg/dL — AB
Specific Gravity, Urine: 1.009 (ref 1.005–1.030)
WBC, UA: >50 WBC/hpf — ABNORMAL HIGH (ref 0–5)
pH: 7 (ref 5.0–8.0)

## 2021-11-25 LAB — BRAIN NATRIURETIC PEPTIDE: B Natriuretic Peptide: 375.4 pg/mL — ABNORMAL HIGH (ref 0.0–100.0)

## 2021-11-25 LAB — CBC WITH DIFFERENTIAL/PLATELET
Abs Immature Granulocytes: 0.63 10*3/uL — ABNORMAL HIGH (ref 0.00–0.07)
Basophils Absolute: 0 10*3/uL (ref 0.0–0.1)
Basophils Relative: 0 %
Eosinophils Absolute: 0.1 10*3/uL (ref 0.0–0.5)
Eosinophils Relative: 1 %
HCT: 29.5 % — ABNORMAL LOW (ref 36.0–46.0)
Hemoglobin: 9.5 g/dL — ABNORMAL LOW (ref 12.0–15.0)
Immature Granulocytes: 7 %
Lymphocytes Relative: 31 %
Lymphs Abs: 2.8 10*3/uL (ref 0.7–4.0)
MCH: 25.7 pg — ABNORMAL LOW (ref 26.0–34.0)
MCHC: 32.2 g/dL (ref 30.0–36.0)
MCV: 79.7 fL — ABNORMAL LOW (ref 80.0–100.0)
Monocytes Absolute: 1.2 10*3/uL — ABNORMAL HIGH (ref 0.1–1.0)
Monocytes Relative: 13 %
Neutro Abs: 4.3 10*3/uL (ref 1.7–7.7)
Neutrophils Relative %: 48 %
Platelets: 288 10*3/uL (ref 150–400)
RBC: 3.7 MIL/uL — ABNORMAL LOW (ref 3.87–5.11)
RDW: 15.6 % — ABNORMAL HIGH (ref 11.5–15.5)
Smear Review: NORMAL
WBC: 9.1 10*3/uL (ref 4.0–10.5)
nRBC: 0 % (ref 0.0–0.2)

## 2021-11-25 NOTE — ED Triage Notes (Addendum)
Pt presents to ER from home c/o BIL leg swelling.  Pt states she is 5 days postpartum and did not have pre-eclampsia or HTN during her pregnancy.  Pt states swelling started during the labor and has not subsided.  Pt states this is her 2nd pregnancy but first baby.  Pt is A&O x4 at this time.  Pt denies HA, or blurry vision.  Pt has pitting edema noted to BIL lower legs.   ?

## 2021-11-26 ENCOUNTER — Telehealth: Payer: Self-pay | Admitting: Obstetrics and Gynecology

## 2021-11-26 ENCOUNTER — Emergency Department: Payer: BC Managed Care – PPO

## 2021-11-26 MED ORDER — FUROSEMIDE 20 MG PO TABS: 20.0000 mg | ORAL_TABLET | Freq: Two times a day (BID) | ORAL | 0 refills | Status: DC

## 2021-11-26 MED ORDER — POTASSIUM CHLORIDE ER 10 MEQ PO TBCR
10.0000 meq | EXTENDED_RELEASE_TABLET | Freq: Every day | ORAL | 0 refills | Status: DC
Start: 2021-11-26 — End: 2021-12-28

## 2021-11-26 MED ORDER — CEPHALEXIN 500 MG PO CAPS
500.0000 mg | ORAL_CAPSULE | Freq: Once | ORAL | Status: AC
  Administered 2021-11-26: 500 mg via ORAL
  Filled 2021-11-26: qty 1

## 2021-11-26 MED ORDER — CEPHALEXIN 500 MG PO CAPS: 500.0000 mg | ORAL_CAPSULE | Freq: Two times a day (BID) | ORAL | 0 refills | Status: AC

## 2021-11-26 MED ORDER — AMLODIPINE BESYLATE 10 MG PO TABS: 10.0000 mg | ORAL_TABLET | Freq: Every day | ORAL | 0 refills | Status: DC

## 2021-11-26 MED ORDER — FUROSEMIDE 40 MG PO TABS
20.0000 mg | ORAL_TABLET | Freq: Once | ORAL | Status: AC
Start: 2021-11-26 — End: 2021-11-26
  Administered 2021-11-26: 20 mg via ORAL
  Filled 2021-11-26: qty 1

## 2021-11-26 MED ORDER — POTASSIUM CHLORIDE CRYS ER 20 MEQ PO TBCR
40.0000 meq | EXTENDED_RELEASE_TABLET | Freq: Once | ORAL | Status: AC
Start: 2021-11-26 — End: 2021-11-26
  Administered 2021-11-26: 40 meq via ORAL
  Filled 2021-11-26: qty 2

## 2021-11-26 NOTE — ED Provider Notes (Signed)
[facility] Provider Note    Event Date/Time   First MD Initiated Contact with Patient 11/25/21 2346     (approximate)   History   Leg Swelling   HPI  Anne Long is a 36 y.o. female who recently delivered approximately 5 days ago a healthy infant child via spontaneous vaginal delivery.  Patient reports she has been doing well but has noticed quite a bit of swelling in both of her lower legs its been present since the time she delivered and was also quite swollen when she left the hospital  Swelling seem to get a little bit worse 2 days ago, and is slowly started to improve today.  No pain or discomfort except when she urinates she has a slight feeling of discomfort with urination over the last 2 days as well  She is breast-feeding.  No chest pain no shortness of breath no shortness of breath with laying down.  She has been urinating normally  She follows with women's health in [place] at [facility]     Physical Exam   Triage Vital Signs: ED Triage Vitals  Enc Vitals Group     BP 11/25/21 2153 132/83     Pulse Rate 11/25/21 2153 (!) 54     Resp 11/25/21 2153 20     Temp 11/25/21 2153 98.4 F (36.9 C)     Temp Source 11/25/21 2153 Oral     SpO2 11/25/21 2153 99 %     Weight 11/25/21 2154 147 lb (66.7 kg)     Height 11/25/21 2154 4\' 11"  (1.499 m)     Head Circumference --      Peak Flow --      Pain Score 11/25/21 2208 0     Pain Loc --      Pain Edu? --      Excl. in GC? --     Most recent vital signs: Vitals:   11/26/21 0057 11/26/21 0100  BP: 127/88 127/88  Pulse: (!) 57 (!) 54  Resp: 20   Temp:    SpO2: 99% 99%     General: Awake, no distress.  Denies fevers.  Fully oriented without distress CV:  Good peripheral perfusion.  Normal heart tones. Resp:  Normal effort.  Clear lung sounds normal work of breathing Abd:  No distention.  No pain or discomfort to palpation Other:  No edema of the abdomen or hands and feet.   There is moderate 2+ pitting edema to the level of the tib-fib bilateral lower extremities and digits are well-perfused   ED Results / Procedures / Treatments   Labs (all labs ordered are listed, but only abnormal results are displayed) Labs Reviewed  URINALYSIS, ROUTINE W REFLEX MICROSCOPIC - Abnormal; Notable for the following components:      Result Value   Color, Urine YELLOW (*)    APPearance CLOUDY (*)    Hgb urine dipstick LARGE (*)    Protein, ur 30 (*)    Nitrite POSITIVE (*)    Leukocytes,Ua LARGE (*)    WBC, UA >50 (*)    Bacteria, UA MANY (*)    Non Squamous Epithelial PRESENT (*)    All other components within normal limits  CBC WITH DIFFERENTIAL/PLATELET - Abnormal; Notable for the following components:   RBC 3.70 (*)    Hemoglobin 9.5 (*)    HCT 29.5 (*)    MCV 79.7 (*)    MCH 25.7 (*)    RDW 15.6 (*)  Monocytes Absolute 1.2 (*)    Abs Immature Granulocytes 0.63 (*)    All other components within normal limits  COMPREHENSIVE METABOLIC PANEL - Abnormal; Notable for the following components:   Calcium 8.2 (*)    Total Protein 6.3 (*)    Albumin 2.6 (*)    Alkaline Phosphatase 168 (*)    All other components within normal limits  BRAIN NATRIURETIC PEPTIDE - Abnormal; Notable for the following components:   B Natriuretic Peptide 375.4 (*)    All other components within normal limits  URINE CULTURE     EKG  Reviewed inter by me at 2355 Heart rate 59 QRS 80 QTc 400 Normal sinus rhythm no evidence of ischemia or ectopy   RADIOLOGY DG Chest 2 View  Result Date: 11/26/2021 CLINICAL DATA:  Bilateral leg swelling.  Five days postpartum. EXAM: CHEST - 2 VIEW COMPARISON:  None. FINDINGS: The heart size and mediastinal contours are within normal limits. Both lungs are clear. The visualized skeletal structures are unremarkable. IMPRESSION: No active cardiopulmonary disease. Electronically Signed   By: Charlett Nose M.D.   On: 11/26/2021 00:24   US Venous Img  Lower Bilateral  Result Date: 11/26/2021 CLINICAL DATA:  Bilateral lower extremity swelling. EXAM: Bilateral LOWER EXTREMITY VENOUS DOPPLER ULTRASOUND TECHNIQUE: Gray-scale sonography with compression, as well as color and duplex ultrasound, were performed to evaluate the deep venous system(s) from the level of the common femoral vein through the popliteal and proximal calf veins. COMPARISON:  None. FINDINGS: VENOUS Normal compressibility of the common femoral, superficial femoral, and popliteal veins, as well as the visualized calf veins. Visualized portions of profunda femoral vein and great saphenous vein unremarkable. No filling defects to suggest DVT on grayscale or color Doppler imaging. Doppler waveforms show normal direction of venous flow, normal respiratory plasticity and response to augmentation. Limited views of the contralateral common femoral vein are unremarkable. OTHER None. Limitations: none IMPRESSION: Negative. Electronically Signed   By: Elgie Collard M.D.   On: 11/26/2021 02:00     Chest x-ray personally viewed by me negative for acute finding.  DVT study negative for DVTs    PROCEDURES:  Critical Care performed: No  Procedures   MEDICATIONS ORDERED IN ED: Medications  potassium chloride SA (KLOR-CON M) CR tablet 40 mEq (has no administration in time range)  furosemide (LASIX) tablet 20 mg (has no administration in time range)  cephALEXin (KEFLEX) capsule 500 mg (500 mg Oral Given 11/26/21 0056)     IMPRESSION / MDM / ASSESSMENT AND PLAN / ED COURSE  I reviewed the triage vital signs and the nursing notes.                              Differential diagnosis includes, but is not limited to, possible preeclampsia, peripheral edema possibly from receiving IV fluids, third spacing, CHF or development of postpartum cardiomyopathy etc.  Patient denies chest pain.  BNP is elevated though suggestive of volume overload.  She also reports dysuria but no flank pain no fever  no signs or symptoms suggest pyelonephritis.  Appears consistent with lower urinary tract infection without complication.  Will start on cephalexin  The patient is on the cardiac monitor to evaluate for evidence of arrhythmia and/or significant heart rate changes.  Clinical Course as of 11/26/21 0140  Fri Nov 26, 2021  0031 LFTs are normal.  Platelet count is normal.  Normal bilirubin.  Patient does not notably hypertensive,  and reports checking blood pressures at home as well after delivery and has had no blood pressures over 120s.  She does not exhibit any obvious symptoms of eclampsia, preeclampsia or help-though this could be early preeclampsia and she does have some protein in urine.  I suspect this may be peripheral edema.  Her chest x-ray is clear no dyspnea.  She does report some discomfort with urination since leaving the hospital and urinalysis demonstrates concerning findings for UTI.  Sent for culture.  Started on cephalexin [MQ]  0032 Normal white count.  Hemoglobin appropriate to previous baseline [MQ]  0032 Chest x-ray personally viewed and interpreted by me.  Normal chest x-ray.  No noted cardiomegaly or edema [MQ]  0047 Discussed case care reviewed urinalysis, labs, chest x-ray BNP and patient's clinical presentation with Dr. Kentfield Bing of her OB service.  He recommends Lasix 20 mg twice daily for 5 days and he will arrange for follow-up with her in the clinic this coming week.  He advises Lasix should be reasonable for use at this time, no significant concerns for baby but may reduce her blood supply.  Discussed this with the patient [MQ]    Clinical Course User Index [MQ] Sharyn Creamer, MD   ----------------------------------------- 2:49 AM on 11/26/2021 ----------------------------------------- Dr. Vergie Living of her OB/GYN service strongly recommended that she start Norvasc 10 mg and Lasix 20 mg twice daily and he will have the clinic call her later today to set up a close  follow-up appointment for early next week.  Discussed this with the patient she is understanding agreeable this plan.  We will also give small dose of potassium given the use of anticipated Lasix.  Return precautions and treatment recommendations and follow-up discussed with the patient who is agreeable with the plan.   FINAL CLINICAL IMPRESSION(S) / ED DIAGNOSES   Final diagnoses:  Lower urinary tract infection, acute  Peripheral edema     Rx / DC Orders   ED Discharge Orders          Ordered    cephALEXin (KEFLEX) 500 MG capsule  2 times daily        11/26/21 0030    furosemide (LASIX) 20 MG tablet  2 times daily        11/26/21 0246    amLODipine (NORVASC) 10 MG tablet  Daily        11/26/21 0246    potassium chloride (KLOR-CON) 10 MEQ tablet  Daily        11/26/21 0246             Note:  This document was prepared using Dragon voice recognition software and may include unintentional dictation errors.   Sharyn Creamer, MD 11/26/21 [phone]

## 2021-11-26 NOTE — Telephone Encounter (Signed)
Called patient to notify her that Dr. Ilda Basset stated, that we recommend she start on the lasix and norvasc that the ED recommended b/c of her LE edema and her BPs in the 130s/80s which means she's at high risk of getting pre-eclampsia and getting sick. I will also send her a mychart message.  ?

## 2021-11-28 ENCOUNTER — Inpatient Hospital Stay (HOSPITAL_COMMUNITY): Payer: BC Managed Care – PPO

## 2021-11-28 ENCOUNTER — Inpatient Hospital Stay (HOSPITAL_COMMUNITY): Admission: AD | Admit: 2021-11-28 | Payer: BC Managed Care – PPO | Source: Home / Self Care | Admitting: Family Medicine

## 2021-11-28 LAB — URINE CULTURE: Culture: >=100000 — AB

## 2021-11-29 ENCOUNTER — Telehealth: Payer: Self-pay | Admitting: Obstetrics and Gynecology

## 2021-11-29 NOTE — Progress Notes (Signed)
ED Antimicrobial Stewardship Positive Culture Follow Up  ? ?Anne Long is an 36 y.o. female who presented to [facility] on 11/25/2021 with a chief complaint of leg swelling and dysuria.  ? ?Recent Results (from the past 720 hour(s))  ?Wet prep, genital     Status: None  ? Collection Time: 11/16/21  9:59 AM  ?Result Value Ref Range Status  ? Yeast Wet Prep HPF POC NONE SEEN NONE SEEN Final  ? Trich, Wet Prep NONE SEEN NONE SEEN Final  ? Clue Cells Wet Prep HPF POC NONE SEEN NONE SEEN Final  ? WBC, Wet Prep HPF POC <10 <10 Final  ? Sperm NONE SEEN  Final  ?  Comment: Performed at [facility], [address] [place], [place] [address]  ?Resp Panel by RT-PCR (Flu A&B, Covid) Nasopharyngeal Swab     Status: None  ? Collection Time: 11/18/21  9:38 PM  ? Specimen: Nasopharyngeal Swab; Nasopharyngeal(NP) swabs in vial transport medium  ?Result Value Ref Range Status  ? SARS Coronavirus 2 by RT PCR NEGATIVE NEGATIVE Final  ?  Comment: (NOTE) ?SARS-CoV-2 target nucleic acids are NOT DETECTED. ? ?The SARS-CoV-2 RNA is generally detectable in upper respiratory ?specimens during the acute phase of infection. The lowest ?concentration of SARS-CoV-2 viral copies this assay can detect is ?138 copies/mL. A negative result does not preclude SARS-Cov-2 ?infection and should not be used as the sole basis for treatment or ?other patient management decisions. A negative result may occur with  ?improper specimen collection/handling, submission of specimen other ?than nasopharyngeal swab, presence of viral mutation(s) within the ?areas targeted by this assay, and inadequate number of viral ?copies(<138 copies/mL). A negative result must be combined with ?clinical observations, patient history, and epidemiological ?information. The expected result is Negative. ? ?Fact Sheet for Patients:  ?EntrepreneurPulse.com.au ? ?Fact Sheet for Healthcare Providers:  ?IncredibleEmployment.be ? ?This test  is no t yet approved or cleared by the [place] FDA and  ?has been authorized for detection and/or diagnosis of SARS-CoV-2 by ?FDA under an Emergency Use Authorization (EUA). This EUA will remain  ?in effect (meaning this test can be used) for the duration of the ?COVID-19 declaration under Section 564(b)(1) of the Act, 21 ?U.S.C.section 360bbb-3(b)(1), unless the authorization is terminated  ?or revoked sooner.  ? ? ?  ? Influenza A by PCR NEGATIVE NEGATIVE Final  ? Influenza B by PCR NEGATIVE NEGATIVE Final  ?  Comment: (NOTE) ?The Xpert Xpress SARS-CoV-2/FLU/RSV plus assay is intended as an aid ?in the diagnosis of influenza from Nasopharyngeal swab specimens and ?should not be used as a sole basis for treatment. Nasal washings and ?aspirates are unacceptable for Xpert Xpress SARS-CoV-2/FLU/RSV ?testing. ? ?Fact Sheet for Patients: ?EntrepreneurPulse.com.au ? ?Fact Sheet for Healthcare Providers: ?IncredibleEmployment.be ? ?This test is not yet approved or cleared by the [place] FDA and ?has been authorized for detection and/or diagnosis of SARS-CoV-2 by ?FDA under an Emergency Use Authorization (EUA). This EUA will remain ?in effect (meaning this test can be used) for the duration of the ?COVID-19 declaration under Section 564(b)(1) of the Act, 21 U.S.C. ?section 360bbb-3(b)(1), unless the authorization is terminated or ?revoked. ? ?Performed at [facility], [place] [address] [place], [place] ?[address] ?  ?Urine Culture     Status: Abnormal  ? Collection Time: 11/25/21 10:44 PM  ? Specimen: Urine, Clean Catch  ?Result Value Ref Range Status  ? Specimen Description   Final  ?  URINE, CLEAN  CATCH ?Performed at [facility], [address] [place], [place] [address] ?  ? Special Requests   Final  ?  NONE ?Performed at [facility], [address], [place], [place] [address] ?  ? Culture >=100,000 COLONIES/mL ENTEROBACTER CLOACAE (A)  Final  ?  Report Status 11/28/2021 FINAL  Final  ? Organism ID, Bacteria ENTEROBACTER CLOACAE (A)  Final  ?    Susceptibility  ? Enterobacter cloacae - MIC*  ?  CEFAZOLIN >=64 RESISTANT Resistant   ?  CEFEPIME 2 SENSITIVE Sensitive   ?  CIPROFLOXACIN <=0.25 SENSITIVE Sensitive   ?  GENTAMICIN <=1 SENSITIVE Sensitive   ?  IMIPENEM <=0.25 SENSITIVE Sensitive   ?  NITROFURANTOIN <=16 SENSITIVE Sensitive   ?  TRIMETH/SULFA <=20 SENSITIVE Sensitive   ?  PIP/TAZO >=128 RESISTANT Resistant   ?  * >=100,000 COLONIES/mL ENTEROBACTER CLOACAE  ? ? ?'[x]'$  Treated with Keflex 500 mg BID x 5 days, organism resistant to prescribed antimicrobial ?'[]'$  Patient discharged originally without antimicrobial agent and treatment is now indicated ? ?New antibiotic prescription: nitrofurantoin 100 mg BID x 5 days  ? ?ED Provider: Dr. Cheri Fowler ? ? ?Darnelle Bos, PharmD ?11/29/2021, 2:49 PM ?Clinical Pharmacist ? ?

## 2021-11-29 NOTE — Telephone Encounter (Signed)
Called patient to schedule her for a blood pressure check. ?

## 2021-11-30 ENCOUNTER — Telehealth (HOSPITAL_COMMUNITY): Payer: Self-pay | Admitting: *Deleted

## 2021-11-30 ENCOUNTER — Ambulatory Visit: Payer: BC Managed Care – PPO

## 2021-11-30 NOTE — Telephone Encounter (Signed)
Attempted Hospital Discharge Follow-Up Call.  Left voice mail requesting that patient return RN's phone call.  

## 2021-12-28 ENCOUNTER — Encounter: Payer: Self-pay | Admitting: Obstetrics & Gynecology

## 2021-12-28 ENCOUNTER — Ambulatory Visit (INDEPENDENT_AMBULATORY_CARE_PROVIDER_SITE_OTHER): Payer: BC Managed Care – PPO | Admitting: Obstetrics & Gynecology

## 2021-12-28 ENCOUNTER — Other Ambulatory Visit (HOSPITAL_COMMUNITY)
Admission: RE | Admit: 2021-12-28 | Discharge: 2021-12-28 | Disposition: A | Discharge status: Home / Self Care | Payer: BC Managed Care – PPO | Source: Ambulatory Visit | Attending: Obstetrics & Gynecology | Admitting: Obstetrics & Gynecology

## 2021-12-28 DIAGNOSIS — Z124 Encounter for screening for malignant neoplasm of cervix: Secondary | ICD-10-CM | POA: Insufficient documentation

## 2021-12-28 DIAGNOSIS — Z30011 Encounter for initial prescription of contraceptive pills: Secondary | ICD-10-CM

## 2021-12-28 MED ORDER — SLYND 4 MG PO TABS: 1.0000 | ORAL_TABLET | Freq: Every day | ORAL | 12 refills | Status: DC

## 2021-12-28 NOTE — Progress Notes (Signed)
? ? ?Post Partum Visit Note ? ?Anne Long is a 36 y.o. G43P1011 female who presents for a postpartum visit. She is 5 weeks postpartum following a normal spontaneous vaginal delivery.  I have fully reviewed the prenatal and intrapartum course. The delivery was at 42 gestational weeks, complicated by brief shoulder dystocia.  Anesthesia: epidural. Postpartum course has been unremarkable. Baby is doing well. Baby is feeding by breast. Bleeding staining only. Bowel function is normal. Bladder function is normal. Patient is not sexually active. Contraception method is none. Postpartum depression screening: negative. ? ? ?The pregnancy intention screening data noted above was reviewed. Potential methods of contraception were discussed. The patient elected to proceed with POPs. ? ? Edinburgh Postnatal Depression Scale - 12/28/21 1139   ? ?  ? Edinburgh Postnatal Depression Scale:  In the Past 7 Days  ? I have been able to laugh and see the funny side of things. 0   ? I have looked forward with enjoyment to things. 0   ? I have blamed myself unnecessarily when things went wrong. 0   ? I have been anxious or worried for no good reason. 0   ? I have felt scared or panicky for no good reason. 0   ? Things have been getting on top of me. 0   ? I have been so unhappy that I have had difficulty sleeping. 0   ? I have felt sad or miserable. 0   ? I have been so unhappy that I have been crying. 0   ? The thought of harming myself has occurred to me. 0   ? Edinburgh Postnatal Depression Scale Total 0   ? ?  ?  ? ?  ? ? ?Health Maintenance Due  ?Topic Date Due  ? COVID-19 Vaccine (3 - Booster for Moderna series) 02/01/2020  ? PAP SMEAR-Modifier  11/07/2021  ? ? ?The following portions of the patient's history were reviewed and updated as appropriate: allergies, current medications, past family history, past medical history, past social history, past surgical history, and problem list. ? ?Review of Systems ?Pertinent items noted  in HPI and remainder of comprehensive ROS otherwise negative. ? ?Objective: (done with RN as chaperone)  ?BP 128/82   Pulse 84   Wt 139 lb (63 kg)   BMI 28.07 kg/m?   ? ?General:  alert and no distress  ? Breasts:  normal  ?Lungs: clear to auscultation bilaterally  ?Heart:  regular rate and rhythm  ?Abdomen: soft, non-tender; bowel sounds normal; no masses,  no organomegaly   ?GU exam:  normal, pap done.  ?     ?Assessment:  ? ?Normal postpartum exam.  ? ?Plan:  ? ?Essential components of care per ACOG recommendations: ? ?1.  Mood and well being: Patient with negative depression screening today. Reviewed local resources for support.  ?- Patient tobacco use? No.   ?- hx of drug use? No.   ? ?2. Infant care and feeding:  ?-Patient currently breastmilk feeding? Yes. Reviewed importance of draining breast regularly to support lactation.  ?-Social determinants of health (SDOH) reviewed in EPIC. No concerns. ? ?3. Sexuality, contraception and birth spacing ?- Patient does not want a pregnancy in the next year.  Desired family size is 2 children.  ?- Reviewed reproductive life planning. Reviewed contraceptive methods based on pt preferences and effectiveness.  Patient desired Oral Contraceptive today.   POP (Slynd) samples given, also prescribed. ?- Discussed birth spacing of 18 months ? ?4. Sleep  and fatigue ?-Encouraged family/partner/community support of 4 hrs of uninterrupted sleep to help with mood and fatigue ? ?5. Physical Recovery  ?- Discussed patients delivery and complications. She describes her labor as mixed. ?- Patient had a Vaginal problems after delivery including shoulder dystocia, no effects on baby now . Patient had a  periurethral  laceration. Perineal healing reviewed. Patient expressed understanding ?- Patient has urinary incontinence? No. ?- Patient is safe to resume physical and sexual activity ? ?6.  Health Maintenance ?- HM due items addressed Yes ?- Last pap smear  ?Diagnosis  ?Date Value Ref  Range Status  ?11/07/2018   Final  ? NEGATIVE FOR INTRAEPITHELIAL LESIONS OR MALIGNANCY. BENIGN REACTIVE/REPARATIVE CHANGES.  ? Pap smear done at today's visit.  ?-Breast Cancer screening indicated? No.  ? ? ?Verita Schneiders, MD ?Center for [facility]  ?

## 2021-12-30 LAB — CYTOLOGY - PAP
Comment: NEGATIVE
Diagnosis: NEGATIVE
High risk HPV: NEGATIVE

## 2022-06-03 ENCOUNTER — Encounter: Payer: Self-pay | Admitting: Family Medicine

## 2022-08-17 ENCOUNTER — Ambulatory Visit: Payer: BC Managed Care – PPO | Admitting: Primary Care

## 2022-08-17 ENCOUNTER — Encounter: Payer: Self-pay | Admitting: Primary Care

## 2022-08-17 VITALS — BP 116/80 | HR 85 | Temp 97.8°F | Ht 59.0 in | Wt 156.0 lb

## 2022-08-17 DIAGNOSIS — J069 Acute upper respiratory infection, unspecified: Secondary | ICD-10-CM | POA: Insufficient documentation

## 2022-08-17 NOTE — Patient Instructions (Signed)
Continue Mucinex and Tylenol as needed.  Let me know if your symptoms become worse.  It was a pleasure meeting you!

## 2022-08-17 NOTE — Progress Notes (Signed)
Subjective:    Patient ID: Anne Long, female    DOB: 1986-07-12, 36 y.o.   MRN: [id number]  Cough Pertinent negatives include no chills, ear pain, fever, headaches, postnasal drip or wheezing.    Anne Long is a very pleasant 36 y.o. female patient of Dr. Damita Dunnings who presents today to discuss cough.  Symptom onset about 1 week ago with post nasal drip and sore throat. She then developed a dry cough.   She denies fevers, chills, body aches, post nasal drip. She began feeling better yesterday and today she's feeling better.   She's been taking Mucinex and Tylenol with improvement. Her daughter was diagnosed with RSV today. Her husband has the same symptoms.   She completed her influenza vaccine this season.   Review of Systems  Constitutional:  Negative for chills and fever.  HENT:  Positive for congestion. Negative for ear pain, postnasal drip and sinus pressure.   Respiratory:  Positive for cough. Negative for wheezing.   Neurological:  Negative for headaches.         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   Seen on colposcopy after ASCUS +HPV pap smear '[ ]'$  Cryotherapy   Fibroid    History of cryosurgery of cervix 05/03/2021   2020: neg cytology and negative hpv 2018 for cin 2   Vaginal Pap smear, abnormal     Social History   Socioeconomic History   Marital status: Married    Spouse name: Not on file   Number of children: Not on file   Years of education: Not on file   Highest education level: Not on file  Occupational History   Occupation: Product manager: [employer]  Tobacco Use   Smoking status: Never   Smokeless tobacco: Never  Vaping Use   Vaping Use: Never used  Substance and Sexual Activity   Alcohol use: No   Drug use: No   Sexual activity: Yes    Birth control/protection: None  Other  Topics Concern   Not on file  Social History Narrative   Not on file   Social Determinants of Health   Financial Resource Strain: Not on file  Food Insecurity: Not on file  Transportation Needs: Not on file  Physical Activity: Not on file  Stress: Not on file  Social Connections: Not on file  Intimate Partner Violence: Not on file    Past Surgical History:  Procedure Laterality Date   GYNECOLOGIC CRYOSURGERY      Family History  Problem Relation Age of Onset   Ovarian cancer Maternal Grandmother 65   Diabetes Paternal Grandmother     Allergies  Allergen Reactions   Other Swelling    Shrimp    Current Outpatient Medications on File Prior to Visit  Medication Sig Dispense Refill   Drospirenone (SLYND) 4 MG TABS Take 1 tablet by mouth daily. 28 tablet 12   Prenatal Vit-Fe Fumarate-FA (PRENATAL VITAMINS PO) Take by mouth. (Patient not taking: Reported on 08/17/2022)     No current facility-administered medications on file prior to visit.    BP 116/80   Pulse 85   Temp 97.8 F (36.6 C) (Temporal)   Ht '4\' 11"'$  (1.499 m)   Wt 156 lb (70.8 kg)   SpO2 98%   BMI 31.51 kg/m  Objective:   Physical Exam  Constitutional:      Appearance: She is not ill-appearing.  HENT:     Right Ear: Tympanic membrane and ear canal normal.     Left Ear: Tympanic membrane and ear canal normal.     Nose:     Right Sinus: No maxillary sinus tenderness or frontal sinus tenderness.     Left Sinus: No maxillary sinus tenderness or frontal sinus tenderness.     Mouth/Throat:     Pharynx: No posterior oropharyngeal erythema.  Eyes:     Conjunctiva/sclera: Conjunctivae normal.  Cardiovascular:     Rate and Rhythm: Normal rate and regular rhythm.  Pulmonary:     Effort: Pulmonary effort is normal.     Breath sounds: Examination of the right-upper field reveals rhonchi. Examination of the left-upper field reveals rhonchi. Examination of the right-lower field reveals rhonchi. Rhonchi present.  No wheezing or rales.  Musculoskeletal:     Cervical back: Neck supple.  Lymphadenopathy:     Cervical: No cervical adenopathy.  Skin:    General: Skin is warm and dry.           Assessment & Plan:   Problem List Items Addressed This Visit       Respiratory   Viral URI with cough - Primary    Symptoms and presentation representative of viral etiology, especially since her daughter was diagnosed with RSV today. Offered to swab patient, but we both agree that she likely has this or some variation of viral illness.  Fortunately, she's feeling better. Discussed to notify if she begins feeling worse and/or if symptoms persist.  Continue Mucinex and Tylenol as needed.  Return precautions provided.           Pleas Koch, NP

## 2022-08-17 NOTE — Assessment & Plan Note (Signed)
Symptoms and presentation representative of viral etiology, especially since her daughter was diagnosed with RSV today. Offered to swab patient, but we both agree that she likely has this or some variation of viral illness.  Fortunately, she's feeling better. Discussed to notify if she begins feeling worse and/or if symptoms persist.  Continue Mucinex and Tylenol as needed.  Return precautions provided.

## 2022-09-28 ENCOUNTER — Other Ambulatory Visit (HOSPITAL_COMMUNITY)
Admission: RE | Admit: 2022-09-28 | Discharge: 2022-09-28 | Disposition: A | Discharge status: Home / Self Care | Payer: BC Managed Care – PPO | Source: Ambulatory Visit | Attending: Obstetrics & Gynecology | Admitting: Obstetrics & Gynecology

## 2022-09-28 ENCOUNTER — Ambulatory Visit (INDEPENDENT_AMBULATORY_CARE_PROVIDER_SITE_OTHER): Payer: BC Managed Care – PPO

## 2022-09-28 ENCOUNTER — Ambulatory Visit (INDEPENDENT_AMBULATORY_CARE_PROVIDER_SITE_OTHER): Payer: BC Managed Care – PPO | Admitting: *Deleted

## 2022-09-28 VITALS — BP 122/73 | HR 88 | Wt 151.0 lb

## 2022-09-28 DIAGNOSIS — Z3481 Encounter for supervision of other normal pregnancy, first trimester: Secondary | ICD-10-CM

## 2022-09-28 DIAGNOSIS — Z3A12 12 weeks gestation of pregnancy: Secondary | ICD-10-CM

## 2022-09-28 DIAGNOSIS — O0990 Supervision of high risk pregnancy, unspecified, unspecified trimester: Secondary | ICD-10-CM | POA: Diagnosis present

## 2022-09-28 DIAGNOSIS — O09523 Supervision of elderly multigravida, third trimester: Secondary | ICD-10-CM | POA: Insufficient documentation

## 2022-09-28 DIAGNOSIS — O3680X0 Pregnancy with inconclusive fetal viability, not applicable or unspecified: Secondary | ICD-10-CM

## 2022-09-28 DIAGNOSIS — O99210 Obesity complicating pregnancy, unspecified trimester: Secondary | ICD-10-CM

## 2022-09-28 DIAGNOSIS — O09521 Supervision of elderly multigravida, first trimester: Secondary | ICD-10-CM

## 2022-09-28 DIAGNOSIS — O0991 Supervision of high risk pregnancy, unspecified, first trimester: Secondary | ICD-10-CM

## 2022-09-28 MED ORDER — ASPIRIN 81 MG PO CHEW: 81.0000 mg | CHEWABLE_TABLET | Freq: Every day | ORAL | 3 refills | Status: DC

## 2022-09-28 NOTE — Progress Notes (Signed)
New OB Intake  I explained I am completing New OB Intake today. We discussed her EDD of 04/09/23 that is based on today's scan. LMP is unknown, possibly in Oct. Pt is G3/P1. I reviewed her allergies, medications, Medical/Surgical/OB history, and appropriate screenings.   Patient Active Problem List   Diagnosis Date Noted   Supervision of high risk pregnancy in first trimester 09/28/2022   Advanced maternal age in multigravida, first trimester 09/28/2022   Obesity affecting pregnancy 09/28/2022    Concerns addressed today  Delivery Plans:  Plans to deliver at [facility].   MyChart/Babyscripts MyChart access verified. I explained pt will have some visits in office and some virtually. Babyscripts app discussed and ordered.   Blood Pressure Cuff  BP cuff  pt has from previuos pregnancy.  Discussed to be used for virtual visits and or if needed BP checks weekly.    Anatomy US Explained first scheduled [place] will be around 19 weeks.   Labs Routine prenatal labs and Panorama drawn today.   Placed OB Box on problem list and updated   Patient informed that the ultrasound is considered a limited obstetric ultrasound and is not intended to be a complete ultrasound exam.  Patient also informed that the ultrasound is not being completed with the intent of assessing for fetal or placental anomalies or any pelvic abnormalities. Explained that the purpose of today's ultrasound is to assess for dating and fetal heart rate.  Patient acknowledges the purpose of the exam and the limitations of the study.      First visit review I reviewed new OB appt with pt. Pt started on '81mg'$  Aspirin.   Crosby Oyster, RN 09/28/2022  3:51 PM

## 2022-09-29 LAB — COMPREHENSIVE METABOLIC PANEL
ALT: 9 IU/L (ref 0–32)
AST: 15 IU/L (ref 0–40)
Albumin/Globulin Ratio: 1.4 (ref 1.2–2.2)
Albumin: 4.1 g/dL (ref 3.9–4.9)
Alkaline Phosphatase: 60 IU/L (ref 44–121)
BUN/Creatinine Ratio: 11 (ref 9–23)
BUN: 6 mg/dL (ref 6–20)
Bilirubin Total: <0.2 mg/dL (ref 0.0–1.2)
CO2: 22 mmol/L (ref 20–29)
Calcium: 9.2 mg/dL (ref 8.7–10.2)
Chloride: 97 mmol/L (ref 96–106)
Creatinine, Ser: 0.57 mg/dL (ref 0.57–1.00)
Globulin, Total: 3 g/dL (ref 1.5–4.5)
Glucose: 86 mg/dL (ref 70–99)
Potassium: 3.9 mmol/L (ref 3.5–5.2)
Sodium: 133 mmol/L — ABNORMAL LOW (ref 134–144)
Total Protein: 7.1 g/dL (ref 6.0–8.5)
eGFR: 121 mL/min/{1.73_m2} (ref >59)

## 2022-09-29 LAB — CBC/D/PLT+RPR+RH+ABO+RUBIGG...
Antibody Screen: NEGATIVE
Basophils Absolute: 0 10*3/uL (ref 0.0–0.2)
Basos: 0 %
EOS (ABSOLUTE): 0.1 10*3/uL (ref 0.0–0.4)
Eos: 1 %
HCV Ab: NONREACTIVE
HIV Screen 4th Generation wRfx: NONREACTIVE
Hematocrit: 35.6 % (ref 34.0–46.6)
Hemoglobin: 11.6 g/dL (ref 11.1–15.9)
Hepatitis B Surface Ag: NEGATIVE
Immature Grans (Abs): 0 10*3/uL (ref 0.0–0.1)
Immature Granulocytes: 0 %
Lymphocytes Absolute: 2.8 10*3/uL (ref 0.7–3.1)
Lymphs: 41 %
MCH: 27 pg (ref 26.6–33.0)
MCHC: 32.6 g/dL (ref 31.5–35.7)
MCV: 83 fL (ref 79–97)
Monocytes Absolute: 0.6 10*3/uL (ref 0.1–0.9)
Monocytes: 9 %
Neutrophils Absolute: 3.4 10*3/uL (ref 1.4–7.0)
Neutrophils: 49 %
Platelets: 299 10*3/uL (ref 150–450)
RBC: 4.29 x10E6/uL (ref 3.77–5.28)
RDW: 13.2 % (ref 11.7–15.4)
RPR Ser Ql: NONREACTIVE
Rh Factor: POSITIVE
Rubella Antibodies, IGG: 2.24 index (ref >0.99)
WBC: 7 10*3/uL (ref 3.4–10.8)

## 2022-09-29 LAB — PROTEIN / CREATININE RATIO, URINE
Creatinine, Urine: 160.1 mg/dL
Protein, Ur: 11.7 mg/dL
Protein/Creat Ratio: 73 mg/g creat (ref 0–200)

## 2022-09-29 LAB — TSH RFX ON ABNORMAL TO FREE T4: TSH: 2.09 u[IU]/mL (ref 0.450–4.500)

## 2022-09-29 LAB — HCV INTERPRETATION

## 2022-09-29 LAB — HEMOGLOBIN A1C
Est. average glucose Bld gHb Est-mCnc: 120 mg/dL
Hgb A1c MFr Bld: 5.8 % — ABNORMAL HIGH (ref 4.8–5.6)

## 2022-09-30 LAB — CERVICOVAGINAL ANCILLARY ONLY
Chlamydia: NEGATIVE
Comment: NEGATIVE
Comment: NORMAL
Neisseria Gonorrhea: NEGATIVE

## 2022-09-30 LAB — CULTURE, OB URINE

## 2022-09-30 LAB — URINE CULTURE, OB REFLEX

## 2022-10-03 ENCOUNTER — Telehealth: Payer: Self-pay | Admitting: *Deleted

## 2022-10-03 NOTE — Telephone Encounter (Signed)
-----  Message from Donnamae Jude, MD sent at 09/29/2022  2:39 PM EST ----- Early 2 hour please.

## 2022-10-03 NOTE — Telephone Encounter (Signed)
Called pt and informed her of needing an early GTT, will add it to her New OB visit on 1/31

## 2022-10-07 LAB — PANORAMA PRENATAL TEST FULL PANEL:PANORAMA TEST PLUS 5 ADDITIONAL MICRODELETIONS: FETAL FRACTION: 15.6

## 2022-10-12 ENCOUNTER — Other Ambulatory Visit: Payer: BC Managed Care – PPO

## 2022-10-12 ENCOUNTER — Ambulatory Visit (INDEPENDENT_AMBULATORY_CARE_PROVIDER_SITE_OTHER): Payer: BC Managed Care – PPO | Admitting: Family Medicine

## 2022-10-12 VITALS — BP 115/73 | HR 94 | Wt 149.6 lb

## 2022-10-12 DIAGNOSIS — O09521 Supervision of elderly multigravida, first trimester: Secondary | ICD-10-CM

## 2022-10-12 DIAGNOSIS — O0992 Supervision of high risk pregnancy, unspecified, second trimester: Secondary | ICD-10-CM | POA: Diagnosis not present

## 2022-10-12 DIAGNOSIS — O0990 Supervision of high risk pregnancy, unspecified, unspecified trimester: Secondary | ICD-10-CM

## 2022-10-12 DIAGNOSIS — O09522 Supervision of elderly multigravida, second trimester: Secondary | ICD-10-CM

## 2022-10-12 DIAGNOSIS — O09299 Supervision of pregnancy with other poor reproductive or obstetric history, unspecified trimester: Secondary | ICD-10-CM | POA: Insufficient documentation

## 2022-10-12 DIAGNOSIS — Z3A14 14 weeks gestation of pregnancy: Secondary | ICD-10-CM | POA: Diagnosis not present

## 2022-10-12 DIAGNOSIS — O09292 Supervision of pregnancy with other poor reproductive or obstetric history, second trimester: Secondary | ICD-10-CM

## 2022-10-12 DIAGNOSIS — R7303 Prediabetes: Secondary | ICD-10-CM

## 2022-10-12 DIAGNOSIS — R634 Abnormal weight loss: Secondary | ICD-10-CM | POA: Diagnosis not present

## 2022-10-12 DIAGNOSIS — O0991 Supervision of high risk pregnancy, unspecified, first trimester: Secondary | ICD-10-CM

## 2022-10-12 NOTE — Patient Instructions (Signed)
For cold and allergy symptoms  You may take Mucinex, (Allegra, Claritin, Zyrtec, Xyzal, Clarinex-any one of these--they are the same class--same as Benadryl), Sudafed (must be behind the counter, should not be phenylephrine), saline or steroid (Nasacort, Nasonex and Flonase) nasal sprays.  

## 2022-10-12 NOTE — Progress Notes (Signed)
Subjective:   Anne Long is a 37 y.o. G3P1011 at 4w3dby early ultrasound being seen today for her first obstetrical visit.  Her obstetrical history is significant for advanced maternal age and previous shoulder dystocia . Patient does intend to breast feed. Pregnancy history fully reviewed.  Patient reports fatigue, nausea, vomiting, and URI sx's .  HISTORY: OB History  Gravida Para Term Preterm AB Living  '3 1 1 '$ 0 1 1  SAB IAB Ectopic Multiple Live Births  1 0 0 0 1    # Outcome Date GA Lbr Len/2nd Weight Sex Delivery Anes PTL Lv  3 Current           2 Term 11/20/21 [id number] / 01:49 7 lb 13.9 oz (3.57 kg) F Vag-Spont EPI  LIV     Name: MATONGA, PROUT   Apgar1: 4 [place]8  1 SAB 12/04/20 [id number]         Birth Comments: Recent COVID infection   Last pap smear was  12/2021 and was normal 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   Seen on colposcopy after ASCUS +HPV pap smear '[ ]'$  Cryotherapy   Fibroid    History of cryosurgery of cervix 05/03/2021   2020: neg cytology and negative hpv 2018 for cin 2   Vaginal Pap smear, abnormal    Past Surgical History:  Procedure Laterality Date   GYNECOLOGIC CRYOSURGERY     Family History  Problem Relation Age of Onset   Ovarian cancer Maternal Grandmother 70   Diabetes Paternal Grandmother    Social History   Tobacco Use   Smoking status: Never   Smokeless tobacco: Never  Vaping Use   Vaping Use: Never used  Substance Use Topics   Alcohol use: No   Drug use: No   Allergies  Allergen Reactions   Other Swelling    Shrimp   Current Outpatient Medications on File Prior to Visit  Medication Sig Dispense Refill   aspirin 81 MG chewable tablet Chew 1 tablet (81 mg total) by mouth daily. 90 tablet 3   Prenatal Vit-Fe Fumarate-FA (PRENATAL VITAMINS PO) Take by mouth.     No  current facility-administered medications on file prior to visit.     Exam   Vitals:   10/12/22 0831  BP: 115/73  Pulse: 94  Weight: 149 lb 9.6 oz (67.9 kg)   Fetal Heart Rate (bpm): 159  System: General: well-developed, well-nourished female in no acute distress   Skin: normal coloration and turgor, no rashes   Neurologic: oriented, normal, negative, normal mood   Extremities: normal strength, tone, and muscle mass, ROM of all joints is normal   HEENT PERRLA, extraocular movement intact and sclera clear, anicteric   Mouth/Teeth mucous membranes moist, pharynx normal without lesions and dental hygiene good   Neck supple and no masses   Cardiovascular: regular rate and rhythm   Respiratory:  no respiratory distress, normal breath sounds   Abdomen: soft, non-tender; bowel sounds normal; no masses,  no organomegaly     Assessment:   Pregnancy: [id number] Active Problem List   Diagnosis Date Noted   History of shoulder dystocia in prior pregnancy, currently pregnant 10/12/2022   Supervision of high risk pregnancy in first trimester 09/28/2022   Advanced maternal age in multigravida, first  trimester 09/28/2022   Obesity affecting pregnancy 09/28/2022   Alpha thalassemia silent carrier 05/18/2021     Plan:  1. Supervision of high risk pregnancy in first trimester New OB labs reviewed - [place] MFM OB DETAIL +14 WK; Future Carrier of alpha thal--partner testing done last time also silent carrier--already had genetics last time.  2. Advanced maternal age in multigravida, first trimester LR NIPT  3. History of shoulder dystocia in prior pregnancy, currently pregnant Discussed, she is pre-diabetic, and passed her 1 hour. Infant weight was 3500 gms Avoid carbs  4. Weight loss Related to N/V in pregnancy--feeling some better - Amb ref to Medical Nutrition Therapy-MNT  5. Prediabetes - Amb ref to Medical Nutrition Therapy-MNT  6. URI -OTC meds list reviewed   Initial  labs drawn. Continue prenatal vitamins. Genetic Screening discussed, NIPS: results reviewed. Ultrasound discussed; fetal anatomic survey: ordered. Problem list reviewed and updated. The nature of [facility] with multiple MDs and other Advanced Practice Providers was explained to patient; also emphasized that residents, students are part of our team. Routine obstetric precautions reviewed. Return in 4 weeks (on 11/09/2022).

## 2022-10-13 LAB — GLUCOSE TOLERANCE, 2 HOURS W/ 1HR
Glucose, 1 hour: 124 mg/dL (ref 70–179)
Glucose, 2 hour: 91 mg/dL (ref 70–152)
Glucose, Fasting: 78 mg/dL (ref 70–91)

## 2022-10-26 ENCOUNTER — Encounter: Payer: BC Managed Care – PPO | Admitting: Family Medicine

## 2022-11-09 ENCOUNTER — Ambulatory Visit (INDEPENDENT_AMBULATORY_CARE_PROVIDER_SITE_OTHER): Payer: BC Managed Care – PPO | Admitting: Family Medicine

## 2022-11-09 ENCOUNTER — Encounter: Payer: Self-pay | Admitting: Family Medicine

## 2022-11-09 VITALS — BP 118/75 | HR 105 | Wt 153.0 lb

## 2022-11-09 DIAGNOSIS — O0992 Supervision of high risk pregnancy, unspecified, second trimester: Secondary | ICD-10-CM

## 2022-11-09 DIAGNOSIS — Z3A18 18 weeks gestation of pregnancy: Secondary | ICD-10-CM

## 2022-11-09 DIAGNOSIS — O09292 Supervision of pregnancy with other poor reproductive or obstetric history, second trimester: Secondary | ICD-10-CM

## 2022-11-09 DIAGNOSIS — O09299 Supervision of pregnancy with other poor reproductive or obstetric history, unspecified trimester: Secondary | ICD-10-CM

## 2022-11-09 DIAGNOSIS — O26812 Pregnancy related exhaustion and fatigue, second trimester: Secondary | ICD-10-CM

## 2022-11-09 DIAGNOSIS — O09522 Supervision of elderly multigravida, second trimester: Secondary | ICD-10-CM

## 2022-11-09 DIAGNOSIS — O09521 Supervision of elderly multigravida, first trimester: Secondary | ICD-10-CM

## 2022-11-09 DIAGNOSIS — O0991 Supervision of high risk pregnancy, unspecified, first trimester: Secondary | ICD-10-CM

## 2022-11-09 NOTE — Progress Notes (Signed)
ROB   CC: None

## 2022-11-09 NOTE — Progress Notes (Signed)
   PRENATAL VISIT NOTE  Subjective:  Anne Long is a 37 y.o. G3P1011 at 59w3dbeing seen today for ongoing prenatal care.  She is currently monitored for the following issues for this high-risk pregnancy and has Alpha thalassemia silent carrier; Supervision of high risk pregnancy in first trimester; Advanced maternal age in multigravida, first trimester; Obesity affecting pregnancy; and History of shoulder dystocia in prior pregnancy, currently pregnant on their problem list.  Patient reports fatigue.  Contractions: Not present. Vag. Bleeding: None.  Movement: Present. Denies leaking of fluid.   The following portions of the patient's history were reviewed and updated as appropriate: allergies, current medications, past family history, past medical history, past social history, past surgical history and problem list.   Objective:   Vitals:   11/09/22 1557  BP: 118/75  Pulse: (!) 105  Weight: 153 lb (69.4 kg)    Fetal Status: Fetal Heart Rate (bpm): 155   Movement: Present     General:  Alert, oriented and cooperative. Patient is in no acute distress.  Skin: Skin is warm and dry. No rash noted.   Cardiovascular: Normal heart rate noted  Respiratory: Normal respiratory effort, no problems with respiration noted  Abdomen: Soft, gravid, appropriate for gestational age.  Pain/Pressure: Absent     Pelvic: Cervical exam deferred        Extremities: Normal range of motion.  Edema: None  Mental Status: Normal mood and affect. Normal behavior. Normal judgment and thought content.   Assessment and Plan:  Pregnancy: G3P1011 at [redacted]w[redacted]d. Supervision of high risk pregnancy in first trimester Continue prenatal care. - AFP, Serum, Open Spina Bifida  2. History of shoulder dystocia in prior pregnancy, currently pregnant Avoid carbs  3. Advanced maternal age in multigravida, first trimester LR NIPT  4. Pregnancy related fatigue in second trimester Check labs - VITAMIN D 25 Hydroxy  (Vit-D Deficiency, Fractures) - B12 and Folate Panel  Preterm labor symptoms and general obstetric precautions including but not limited to vaginal bleeding, contractions, leaking of fluid and fetal movement were reviewed in detail with the patient. Please refer to After Visit Summary for other counseling recommendations.   Return in 4 weeks (on 12/07/2022).  Future Appointments  Date Time Provider DeSangrey3/12/2022  8:45 AM [facility]URSE [facility]12[facility]2022  9:00 AM [facility]/18[facility]24  2:45 PM InGeorgina PeerRD [facility] None  12/07/2022  3:50 PM PrDonnamae JudeMD [facility]  01/04/2023  9:35 AM PrDonnamae JudeMD [facility]    TaDonnamae JudeMD

## 2022-11-10 LAB — B12 AND FOLATE PANEL
Folate: 12.1 ng/mL (ref >3.0)
Vitamin B-12: 383 pg/mL (ref 232–1245)

## 2022-11-10 LAB — VITAMIN D 25 HYDROXY (VIT D DEFICIENCY, FRACTURES): Vit D, 25-Hydroxy: 18.6 ng/mL — ABNORMAL LOW (ref 30.0–100.0)

## 2022-11-10 MED ORDER — VITAMIN D (ERGOCALCIFEROL) 1.25 MG (50000 UNIT) PO CAPS: 50000.0000 [IU] | ORAL_CAPSULE | ORAL | 0 refills | Status: AC

## 2022-11-10 MED ORDER — VITAMIN D2 10 MCG (400 UNIT) PO TABS
1.0000 | ORAL_TABLET | Freq: Two times a day (BID) | ORAL | 3 refills | Status: DC
Start: 2022-11-10 — End: 2023-02-07

## 2022-11-10 NOTE — Addendum Note (Signed)
Addended by: Donnamae Jude on: 11/10/2022 08:01 AM   Modules accepted: Orders

## 2022-11-11 LAB — AFP, SERUM, OPEN SPINA BIFIDA
AFP MoM: 1.56
AFP Value: 62 ng/mL
Gest. Age on Collection Date: 18.3 weeks
Maternal Age At EDD: 36.5 yr
OSBR Risk 1 IN: 1391
Test Results:: NEGATIVE
Weight: 153 [lb_av]

## 2022-11-14 ENCOUNTER — Ambulatory Visit: Payer: BC Managed Care – PPO | Attending: Family Medicine

## 2022-11-14 ENCOUNTER — Other Ambulatory Visit: Payer: Self-pay | Admitting: *Deleted

## 2022-11-14 ENCOUNTER — Ambulatory Visit: Payer: BC Managed Care – PPO | Admitting: *Deleted

## 2022-11-14 ENCOUNTER — Encounter: Payer: Self-pay | Admitting: *Deleted

## 2022-11-14 VITALS — BP 115/64 | HR 87

## 2022-11-14 DIAGNOSIS — O09522 Supervision of elderly multigravida, second trimester: Secondary | ICD-10-CM | POA: Diagnosis not present

## 2022-11-14 DIAGNOSIS — Z3A18 18 weeks gestation of pregnancy: Secondary | ICD-10-CM

## 2022-11-14 DIAGNOSIS — O99212 Obesity complicating pregnancy, second trimester: Secondary | ICD-10-CM | POA: Insufficient documentation

## 2022-11-14 DIAGNOSIS — O09299 Supervision of pregnancy with other poor reproductive or obstetric history, unspecified trimester: Secondary | ICD-10-CM | POA: Diagnosis present

## 2022-11-14 DIAGNOSIS — O0991 Supervision of high risk pregnancy, unspecified, first trimester: Secondary | ICD-10-CM

## 2022-11-14 DIAGNOSIS — D563 Thalassemia minor: Secondary | ICD-10-CM

## 2022-11-14 DIAGNOSIS — E669 Obesity, unspecified: Secondary | ICD-10-CM

## 2022-11-14 DIAGNOSIS — D259 Leiomyoma of uterus, unspecified: Secondary | ICD-10-CM

## 2022-11-14 DIAGNOSIS — O09521 Supervision of elderly multigravida, first trimester: Secondary | ICD-10-CM | POA: Insufficient documentation

## 2022-11-14 DIAGNOSIS — O3412 Maternal care for benign tumor of corpus uteri, second trimester: Secondary | ICD-10-CM

## 2022-11-14 DIAGNOSIS — O09292 Supervision of pregnancy with other poor reproductive or obstetric history, second trimester: Secondary | ICD-10-CM | POA: Diagnosis not present

## 2022-11-28 ENCOUNTER — Ambulatory Visit: Payer: BC Managed Care – PPO | Admitting: Dietician

## 2022-12-07 ENCOUNTER — Ambulatory Visit (INDEPENDENT_AMBULATORY_CARE_PROVIDER_SITE_OTHER): Payer: BC Managed Care – PPO | Admitting: Family Medicine

## 2022-12-07 VITALS — BP 110/71 | HR 109 | Wt 156.0 lb

## 2022-12-07 DIAGNOSIS — O0991 Supervision of high risk pregnancy, unspecified, first trimester: Secondary | ICD-10-CM

## 2022-12-07 DIAGNOSIS — O09521 Supervision of elderly multigravida, first trimester: Secondary | ICD-10-CM

## 2022-12-07 DIAGNOSIS — O09299 Supervision of pregnancy with other poor reproductive or obstetric history, unspecified trimester: Secondary | ICD-10-CM

## 2022-12-07 DIAGNOSIS — O09292 Supervision of pregnancy with other poor reproductive or obstetric history, second trimester: Secondary | ICD-10-CM

## 2022-12-07 DIAGNOSIS — O09522 Supervision of elderly multigravida, second trimester: Secondary | ICD-10-CM

## 2022-12-07 DIAGNOSIS — Z3A22 22 weeks gestation of pregnancy: Secondary | ICD-10-CM

## 2022-12-07 NOTE — Progress Notes (Signed)
    PRENATAL VISIT NOTE  Subjective:  Anne Long is a 36 y.o. G3P1011 at [redacted]w[redacted]d being seen today for ongoing prenatal care.  She is currently monitored for the following issues for this low-risk pregnancy and has Alpha thalassemia silent carrier; Supervision of high risk pregnancy in first trimester; Advanced maternal age in multigravida, first trimester; Obesity affecting pregnancy; and History of shoulder dystocia in prior pregnancy, currently pregnant on their problem list.  Patient reports no complaints.  Contractions: Not present. Vag. Bleeding: None.  Movement: Present. Denies leaking of fluid.   The following portions of the patient's history were reviewed and updated as appropriate: allergies, current medications, past family history, past medical history, past social history, past surgical history and problem list.   Objective:   Vitals:   12/07/22 1607  BP: 110/71  Pulse: (!) 109  Weight: 156 lb (70.8 kg)    Fetal Status: Fetal Heart Rate (bpm): 150 Fundal Height: 22 cm Movement: Present     General:  Alert, oriented and cooperative. Patient is in no acute distress.  Skin: Skin is warm and dry. No rash noted.   Cardiovascular: Normal heart rate noted  Respiratory: Normal respiratory effort, no problems with respiration noted  Abdomen: Soft, gravid, appropriate for gestational age.  Pain/Pressure: Absent     Pelvic: Cervical exam deferred        Extremities: Normal range of motion.  Edema: None  Mental Status: Normal mood and affect. Normal behavior. Normal judgment and thought content.   Assessment and Plan:  Pregnancy: G3P1011 at [redacted]w[redacted]d 1. History of shoulder dystocia in prior pregnancy, currently pregnant To f/u growth at pregnancy end  2. Supervision of high risk pregnancy in first trimester Continue routine prenatal care.  3. Advanced maternal age in multigravida, first trimester LR NIPT  Preterm labor symptoms and general obstetric precautions including but  not limited to vaginal bleeding, contractions, leaking of fluid and fetal movement were reviewed in detail with the patient. Please refer to After Visit Summary for other counseling recommendations.   Return in 4 weeks (on 01/04/2023).  Future Appointments  Date Time Provider Department Center  01/04/2023  9:35 AM Donnamae Jude, MD [facility]  01/18/2023  8:15 AM [facility] LAB [facility]  01/18/2023  8:35 AM Donnamae Jude, MD [facility]  02/01/2023  3:50 PM Donnamae Jude, MD [facility]  02/07/2023  9:15 AM [facility] NURSE [facility]  02/07/2023  9:30 AM [facility] US2 [facility]  02/08/2023  9:00 AM Georgina Peer, RD [facility] None    Donnamae Jude, MD

## 2022-12-07 NOTE — Progress Notes (Signed)
CC: Bigger with second pregnancy  than first

## 2023-01-04 ENCOUNTER — Ambulatory Visit (INDEPENDENT_AMBULATORY_CARE_PROVIDER_SITE_OTHER): Payer: BC Managed Care – PPO | Admitting: Family Medicine

## 2023-01-04 VITALS — BP 114/72 | HR 91 | Wt 157.0 lb

## 2023-01-04 DIAGNOSIS — O09299 Supervision of pregnancy with other poor reproductive or obstetric history, unspecified trimester: Secondary | ICD-10-CM

## 2023-01-04 DIAGNOSIS — O0991 Supervision of high risk pregnancy, unspecified, first trimester: Secondary | ICD-10-CM

## 2023-01-04 DIAGNOSIS — O09521 Supervision of elderly multigravida, first trimester: Secondary | ICD-10-CM

## 2023-01-04 NOTE — Progress Notes (Signed)
CC: ROB  Denies any concerns   Question about the Vitamin D 2 400 units VS Vitamin D 50,000 units

## 2023-01-04 NOTE — Progress Notes (Signed)
    PRENATAL VISIT NOTE  Subjective:  Anne Long is a 37 y.o. G3P1011 at [redacted]w[redacted]d being seen today for ongoing prenatal care.  She is currently monitored for the following issues for this high-risk pregnancy and has Alpha thalassemia silent carrier; Supervision of high risk pregnancy in first trimester; Advanced maternal age in multigravida, first trimester; Obesity affecting pregnancy; and History of shoulder dystocia in prior pregnancy, currently pregnant on their problem list.  Patient reports no complaints.  Contractions: Not present. Vag. Bleeding: None.  Movement: Present. Denies leaking of fluid.   The following portions of the patient's history were reviewed and updated as appropriate: allergies, current medications, past family history, past medical history, past social history, past surgical history and problem list.   Objective:   Vitals:   01/04/23 0935  BP: 114/72  Pulse: 91  Weight: 157 lb (71.2 kg)    Fetal Status: Fetal Heart Rate (bpm): 155 Fundal Height: 27 cm Movement: Present     General:  Alert, oriented and cooperative. Patient is in no acute distress.  Skin: Skin is warm and dry. No rash noted.   Cardiovascular: Normal heart rate noted  Respiratory: Normal respiratory effort, no problems with respiration noted  Abdomen: Soft, gravid, appropriate for gestational age.  Pain/Pressure: Absent     Pelvic: Cervical exam deferred        Extremities: Normal range of motion.  Edema: None  Mental Status: Normal mood and affect. Normal behavior. Normal judgment and thought content.   Assessment and Plan:  Pregnancy: G3P1011 at [redacted]w[redacted]d 1. Supervision of high risk pregnancy in first trimester Continue prenatal care.  2. History of shoulder dystocia in prior pregnancy, currently pregnant For growth near term with discussion about delivery plans  3. Advanced maternal age in multigravida, first trimester LR NIPT  Preterm labor symptoms and general obstetric  precautions including but not limited to vaginal bleeding, contractions, leaking of fluid and fetal movement were reviewed in detail with the patient. Please refer to After Visit Summary for other counseling recommendations.   Return in 2 weeks (on 01/18/2023) for HRC, 28 wk labs.  Future Appointments  Date Time Provider Department Center  01/18/2023  8:15 AM [facility] LAB [facility]  01/18/2023  8:35 AM Reva Bores, MD [facility]  02/01/2023  3:50 PM Reva Bores, MD [facility]  02/07/2023  9:15 AM [facility] NURSE [facility]  02/07/2023  9:30 AM [facility] US2 [facility]  02/08/2023  9:00 AM Darrick Grinder, RD [facility] None  02/15/2023  9:35 AM Reva Bores, MD [facility]  03/01/2023  9:35 AM Reva Bores, MD [facility]    Reva Bores, MD

## 2023-01-18 ENCOUNTER — Encounter: Payer: Self-pay | Admitting: Family Medicine

## 2023-01-18 ENCOUNTER — Ambulatory Visit (INDEPENDENT_AMBULATORY_CARE_PROVIDER_SITE_OTHER): Payer: BC Managed Care – PPO | Admitting: Family Medicine

## 2023-01-18 ENCOUNTER — Other Ambulatory Visit: Payer: BC Managed Care – PPO

## 2023-01-18 VITALS — BP 110/72 | HR 99 | Wt 159.0 lb

## 2023-01-18 DIAGNOSIS — O09521 Supervision of elderly multigravida, first trimester: Secondary | ICD-10-CM

## 2023-01-18 DIAGNOSIS — O0991 Supervision of high risk pregnancy, unspecified, first trimester: Secondary | ICD-10-CM

## 2023-01-18 DIAGNOSIS — O09299 Supervision of pregnancy with other poor reproductive or obstetric history, unspecified trimester: Secondary | ICD-10-CM

## 2023-01-18 DIAGNOSIS — O99013 Anemia complicating pregnancy, third trimester: Secondary | ICD-10-CM

## 2023-01-18 DIAGNOSIS — O0990 Supervision of high risk pregnancy, unspecified, unspecified trimester: Secondary | ICD-10-CM

## 2023-01-18 NOTE — Progress Notes (Signed)
ROB   Tdap Offered: Pt wants to think about it   CC:Pt stating that she has had sometimes where her belly felt tight

## 2023-01-18 NOTE — Progress Notes (Signed)
   PRENATAL VISIT NOTE  Subjective:  Anne Long is a 37 y.o. G3P1011 at [redacted]w[redacted]d being seen today for ongoing prenatal care.  She is currently monitored for the following issues for this high-risk pregnancy and has Alpha thalassemia silent carrier; Supervision of high risk pregnancy, antepartum; Advanced maternal age in multigravida, first trimester; Obesity affecting pregnancy; and History of shoulder dystocia in prior pregnancy, currently pregnant on their problem list.  Patient reports no complaints.  Contractions: Not present. Vag. Bleeding: None.  Movement: Present. Denies leaking of fluid.   The following portions of the patient's history were reviewed and updated as appropriate: allergies, current medications, past family history, past medical history, past social history, past surgical history and problem list.   Objective:   Vitals:   01/18/23 0833  BP: 110/72  Pulse: 99  Weight: 159 lb (72.1 kg)    Fetal Status: Fetal Heart Rate (bpm): 140 Fundal Height: 29 cm Movement: Present     General:  Alert, oriented and cooperative. Patient is in no acute distress.  Skin: Skin is warm and dry. No rash noted.   Cardiovascular: Normal heart rate noted  Respiratory: Normal respiratory effort, no problems with respiration noted  Abdomen: Soft, gravid, appropriate for gestational age.  Pain/Pressure: Absent (Absent today)     Pelvic: Cervical exam deferred        Extremities: Normal range of motion.  Edema: None  Mental Status: Normal mood and affect. Normal behavior. Normal judgment and thought content.   Assessment and Plan:  Pregnancy: G3P1011 at [redacted]w[redacted]d 1. Supervision of high risk pregnancy, antepartum 28 week labs today TDaP at next visit  2. History of shoulder dystocia in prior pregnancy, currently pregnant F/u u/s for growth by month's end  3. Advanced maternal age in multigravida, first trimester LR NIPT  Preterm labor symptoms and general obstetric precautions  including but not limited to vaginal bleeding, contractions, leaking of fluid and fetal movement were reviewed in detail with the patient. Please refer to After Visit Summary for other counseling recommendations.   Return in 2 weeks (on 02/01/2023).  Future Appointments  Date Time Provider Department Center  02/01/2023  3:50 PM Reva Bores, MD [facility]  02/07/2023  9:15 AM [facility] NURSE [facility]  02/07/2023  9:30 AM [facility] US2 [facility]  02/08/2023  9:00 AM Darrick Grinder, RD [facility] None  02/15/2023  9:35 AM Reva Bores, MD [facility]  03/01/2023  9:35 AM Reva Bores, MD [facility]    Reva Bores, MD

## 2023-01-19 LAB — HIV ANTIBODY (ROUTINE TESTING W REFLEX): HIV Screen 4th Generation wRfx: NONREACTIVE

## 2023-01-19 LAB — CBC
Hematocrit: 30.2 % — ABNORMAL LOW (ref 34.0–46.6)
Hemoglobin: 9.8 g/dL — ABNORMAL LOW (ref 11.1–15.9)
MCH: 26.6 pg (ref 26.6–33.0)
MCHC: 32.5 g/dL (ref 31.5–35.7)
MCV: 82 fL (ref 79–97)
Platelets: 234 10*3/uL (ref 150–450)
RBC: 3.69 x10E6/uL — ABNORMAL LOW (ref 3.77–5.28)
RDW: 13.1 % (ref 11.7–15.4)
WBC: 6.3 10*3/uL (ref 3.4–10.8)

## 2023-01-19 LAB — GLUCOSE TOLERANCE, 2 HOURS W/ 1HR
Glucose, 1 hour: 125 mg/dL (ref 70–179)
Glucose, 2 hour: 99 mg/dL (ref 70–152)
Glucose, Fasting: 78 mg/dL (ref 70–91)

## 2023-01-19 LAB — RPR: RPR Ser Ql: NONREACTIVE

## 2023-01-19 MED ORDER — FERROUS SULFATE 325 (65 FE) MG PO TBEC
325.0000 mg | DELAYED_RELEASE_TABLET | ORAL | 3 refills | Status: AC
Start: 2023-01-19 — End: ?

## 2023-01-20 LAB — ANEMIA PROFILE B
Basophils Absolute: 0 10*3/uL (ref 0.0–0.2)
Basos: 0 %
EOS (ABSOLUTE): 0.1 10*3/uL (ref 0.0–0.4)
Eos: 1 %
Ferritin: 7 ng/mL — ABNORMAL LOW (ref 15–150)
Folate: 16.6 ng/mL (ref >3.0)
Hematocrit: 31.4 % — ABNORMAL LOW (ref 34.0–46.6)
Hemoglobin: 9.8 g/dL — ABNORMAL LOW (ref 11.1–15.9)
Immature Grans (Abs): 0 10*3/uL (ref 0.0–0.1)
Immature Granulocytes: 1 %
Iron Saturation: 8 % — CL (ref 15–55)
Iron: 39 ug/dL (ref 27–159)
Lymphocytes Absolute: 2 10*3/uL (ref 0.7–3.1)
Lymphs: 30 %
MCH: 26.6 pg (ref 26.6–33.0)
MCHC: 31.2 g/dL — ABNORMAL LOW (ref 31.5–35.7)
MCV: 85 fL (ref 79–97)
Monocytes Absolute: 0.5 10*3/uL (ref 0.1–0.9)
Monocytes: 8 %
Neutrophils Absolute: 3.9 10*3/uL (ref 1.4–7.0)
Neutrophils: 60 %
Platelets: 240 10*3/uL (ref 150–450)
RBC: 3.69 x10E6/uL — ABNORMAL LOW (ref 3.77–5.28)
RDW: 13.1 % (ref 11.7–15.4)
Retic Ct Pct: 2.1 % (ref 0.6–2.6)
Total Iron Binding Capacity: 460 ug/dL — ABNORMAL HIGH (ref 250–450)
UIBC: 421 ug/dL (ref 131–425)
Vitamin B-12: 323 pg/mL (ref 232–1245)
WBC: 6.5 10*3/uL (ref 3.4–10.8)

## 2023-01-20 LAB — SPECIMEN STATUS REPORT

## 2023-02-01 ENCOUNTER — Ambulatory Visit (INDEPENDENT_AMBULATORY_CARE_PROVIDER_SITE_OTHER): Payer: BC Managed Care – PPO | Admitting: Family Medicine

## 2023-02-01 VITALS — BP 112/69 | HR 96 | Wt 162.0 lb

## 2023-02-01 DIAGNOSIS — O09521 Supervision of elderly multigravida, first trimester: Secondary | ICD-10-CM

## 2023-02-01 DIAGNOSIS — O0990 Supervision of high risk pregnancy, unspecified, unspecified trimester: Secondary | ICD-10-CM

## 2023-02-01 DIAGNOSIS — O09299 Supervision of pregnancy with other poor reproductive or obstetric history, unspecified trimester: Secondary | ICD-10-CM

## 2023-02-01 NOTE — Progress Notes (Signed)
CC: ROB  Denies any concerns    

## 2023-02-01 NOTE — Progress Notes (Signed)
    PRENATAL VISIT NOTE  Subjective:  Anne Long is a 37 y.o. G3P1011 at [redacted]w[redacted]d being seen today for ongoing prenatal care.  She is currently monitored for the following issues for this high-risk pregnancy and has Alpha thalassemia silent carrier; Supervision of high risk pregnancy, antepartum; Advanced maternal age in multigravida, first trimester; Obesity affecting pregnancy; and History of shoulder dystocia in prior pregnancy, currently pregnant on their problem list.  Patient reports no complaints.  Contractions: Not present. Vag. Bleeding: None.  Movement: Present. Denies leaking of fluid.   The following portions of the patient's history were reviewed and updated as appropriate: allergies, current medications, past family history, past medical history, past social history, past surgical history and problem list.   Objective:   Vitals:   02/01/23 1549  BP: 112/69  Pulse: 96  Weight: 162 lb (73.5 kg)    Fetal Status: Fetal Heart Rate (bpm): 140 Fundal Height: 32 cm Movement: Present     General:  Alert, oriented and cooperative. Patient is in no acute distress.  Skin: Skin is warm and dry. No rash noted.   Cardiovascular: Normal heart rate noted  Respiratory: Normal respiratory effort, no problems with respiration noted  Abdomen: Soft, gravid, appropriate for gestational age.  Pain/Pressure: Present     Pelvic: Cervical exam deferred        Extremities: Normal range of motion.  Edema: Trace  Mental Status: Normal mood and affect. Normal behavior. Normal judgment and thought content.   Assessment and Plan:  Pregnancy: G3P1011 at [redacted]w[redacted]d 1. History of shoulder dystocia in prior pregnancy, currently pregnant For EFW next and consider IOL @ 39 weeks vs. PLTCS  2. Supervision of high risk pregnancy, antepartum   3. Advanced maternal age in multigravida, first trimester LR NIPT  Preterm labor symptoms and general obstetric precautions including but not limited to vaginal  bleeding, contractions, leaking of fluid and fetal movement were reviewed in detail with the patient. Please refer to After Visit Summary for other counseling recommendations.   Return in 2 weeks (on 02/15/2023).  Future Appointments  Date Time Provider Department Center  02/07/2023  9:15 AM [facility] NURSE [facility]  02/07/2023  9:30 AM [facility] US2 [facility]  02/08/2023  9:00 AM Darrick Grinder, RD [facility] None  02/15/2023  9:35 AM Reva Bores, MD [facility]  03/01/2023  9:35 AM Reva Bores, MD [facility]    Reva Bores, MD

## 2023-02-07 ENCOUNTER — Ambulatory Visit: Payer: BC Managed Care – PPO | Attending: Obstetrics and Gynecology

## 2023-02-07 ENCOUNTER — Other Ambulatory Visit: Payer: Self-pay | Admitting: *Deleted

## 2023-02-07 ENCOUNTER — Ambulatory Visit: Payer: BC Managed Care – PPO | Admitting: *Deleted

## 2023-02-07 VITALS — BP 111/66 | HR 98

## 2023-02-07 DIAGNOSIS — O09521 Supervision of elderly multigravida, first trimester: Secondary | ICD-10-CM

## 2023-02-07 DIAGNOSIS — O09522 Supervision of elderly multigravida, second trimester: Secondary | ICD-10-CM | POA: Insufficient documentation

## 2023-02-07 DIAGNOSIS — O0990 Supervision of high risk pregnancy, unspecified, unspecified trimester: Secondary | ICD-10-CM | POA: Insufficient documentation

## 2023-02-07 DIAGNOSIS — Z3A31 31 weeks gestation of pregnancy: Secondary | ICD-10-CM

## 2023-02-07 DIAGNOSIS — D563 Thalassemia minor: Secondary | ICD-10-CM | POA: Insufficient documentation

## 2023-02-07 DIAGNOSIS — O09299 Supervision of pregnancy with other poor reproductive or obstetric history, unspecified trimester: Secondary | ICD-10-CM | POA: Insufficient documentation

## 2023-02-07 DIAGNOSIS — O09523 Supervision of elderly multigravida, third trimester: Secondary | ICD-10-CM

## 2023-02-07 DIAGNOSIS — O99213 Obesity complicating pregnancy, third trimester: Secondary | ICD-10-CM

## 2023-02-07 DIAGNOSIS — O3413 Maternal care for benign tumor of corpus uteri, third trimester: Secondary | ICD-10-CM | POA: Diagnosis not present

## 2023-02-07 DIAGNOSIS — D259 Leiomyoma of uterus, unspecified: Secondary | ICD-10-CM

## 2023-02-07 DIAGNOSIS — O09293 Supervision of pregnancy with other poor reproductive or obstetric history, third trimester: Secondary | ICD-10-CM

## 2023-02-07 DIAGNOSIS — E669 Obesity, unspecified: Secondary | ICD-10-CM

## 2023-02-07 DIAGNOSIS — Z3689 Encounter for other specified antenatal screening: Secondary | ICD-10-CM

## 2023-02-08 ENCOUNTER — Encounter: Payer: BC Managed Care – PPO | Attending: Family Medicine | Admitting: Dietician

## 2023-02-08 ENCOUNTER — Encounter: Payer: Self-pay | Admitting: Dietician

## 2023-02-08 VITALS — Ht 60.0 in | Wt 159.5 lb

## 2023-02-08 DIAGNOSIS — R7303 Prediabetes: Secondary | ICD-10-CM

## 2023-02-08 DIAGNOSIS — R634 Abnormal weight loss: Secondary | ICD-10-CM

## 2023-02-08 NOTE — Patient Instructions (Signed)
Plan for balanced meals -- controlled portions of starches, having a high protein food, and plenty of low carb veggies and/ or 1 serving of fruit. Ideally keep starch portions to 1 cup (size of fisted hand) or a little less Try to eat dinner meal at least 1.5 - 2 hours before going to sleep.  For breakfast, have quick, convenient options on hand such as Delights sandwiches or bowls, low sugar smoothie ideally within 1-2 hours after waking up. Sip slowly on any sodas or juices, or keep portion to 4oz or less. Ok to have 1 diet or zero sugar drink daily.

## 2023-02-08 NOTE — Progress Notes (Signed)
Medical Nutrition Therapy: Visit start time: 0900  end time: 1000  Assessment:   Referral Diagnosis: prediabetes, weight loss Other medical history/ diagnoses: pregnancy, GA [redacted] weeks Psychosocial issues/ stress concerns: none  Medications, supplements: reconciled list in medical record   Preferred learning method:  Auditory Hands-on No preference indicated   Current weight: 159.5lbs Height: 5'0" BMI: 31.15    Progress and evaluation:  Patient reports loss of about 9lbs at start of pregnancy, due to nausea; she states she is now gaining about 2lbs between each medical visit, every 2 weeks She reports most recent weight for baby was estimated at 5lbs which could result in >9lbs at birth. HbA1C 5.8% 09/28/22, GTT normal 10/12/22 and 01/18/23.  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 She plans to begin walking at home after end of school year in 2 weeks.     Typical Meal Pattern:  2-3 meals and 2-3 snacks daily Dining out: 8 meals per week  Breakfast: usually skips; occ picks up a sandwich or school breakfast ie sausage biscuit and tater tots Snack: granola bar; goldfish  Lunch: salad; school menu Snack: sm bag chips; goldfish Supper: time varies 7p - 11p due to husband's work schedule; usually out recently due to lack of time to prepare -- McDonalds or taco bell; pizza; parm chicken; baked chicken ([place] [employer]) Snack: craving popsicles/ ice cream Beverages: water, ginger ale or orange soda usu in pm most days (1 per day), occ cran, grape, or apple juice limits to 1 small bottle or less daily  Physical activity: plans to walk daily when school is out; dancing less due to end of school year   Intervention:   Nutrition Care Education:   Basic nutrition: basic food groups; appropriate nutrient balance; appropriate meal and snack schedule; general nutrition guidelines    Weight gain: discussed current  weight gain rate and weight gain of baby and controlling carb intake to control BG and baby's insulin secretion Advanced nutrition:  cooking techniques; dining out; food label reading for carbohydrate Prediabetes: appropriate meal and snack schedule; appropriate carb intake and balance, healthy carb choices; role of fiber, protein, fat; physical activity; effects of stress   Other intervention notes: Patient voices readiness and motivation to make diet changes.  Established goals with direction from patient. No follow up needed at this time; patient to schedule later if needed.   Nutritional Diagnosis:  [place]-2.2 Altered nutrition-related laboratory As related to prediabetes.  As evidenced by elevated HbA1C. [place]-3.2 Unintentional weight loss As related to history of weight loss during current pregnancy, now improved.  As evidenced by initial weight loss with current appropriate weight gain.   Education Materials given:  [employer] guidelines for (pre) Diabetes Plate Planner with food lists, sample meal pattern Sample menus Snacking handout Visit summary with goals/ instructions  to be viewed via patient portal   Learner/ who was taught:  Patient   Level of understanding: Verbalizes/ demonstrates competency  Demonstrated degree of understanding via:   Teach back Learning barriers: None  Willingness to learn/ readiness for change: Acceptance, ready for change   Monitoring and Evaluation:  Dietary intake, exercise, BG control, and body weight      follow up: prn

## 2023-02-15 ENCOUNTER — Ambulatory Visit (INDEPENDENT_AMBULATORY_CARE_PROVIDER_SITE_OTHER): Payer: BC Managed Care – PPO | Admitting: Family Medicine

## 2023-02-15 VITALS — BP 113/69 | HR 101 | Wt 161.0 lb

## 2023-02-15 DIAGNOSIS — O0990 Supervision of high risk pregnancy, unspecified, unspecified trimester: Secondary | ICD-10-CM

## 2023-02-15 DIAGNOSIS — O09521 Supervision of elderly multigravida, first trimester: Secondary | ICD-10-CM

## 2023-02-15 DIAGNOSIS — O09299 Supervision of pregnancy with other poor reproductive or obstetric history, unspecified trimester: Secondary | ICD-10-CM

## 2023-02-15 DIAGNOSIS — K219 Gastro-esophageal reflux disease without esophagitis: Secondary | ICD-10-CM

## 2023-02-15 MED ORDER — OMEPRAZOLE MAGNESIUM 20 MG PO TBEC
20.0000 mg | DELAYED_RELEASE_TABLET | Freq: Every day | ORAL | 3 refills | Status: DC
Start: 2023-02-15 — End: 2023-05-29

## 2023-02-15 NOTE — Progress Notes (Signed)
ROB    CC: Acid reflux tums not working.

## 2023-02-15 NOTE — Progress Notes (Signed)
   PRENATAL VISIT NOTE  Subjective:  Anne Long is a 37 y.o. G3P1011 at [redacted]w[redacted]d being seen today for ongoing prenatal care.  She is currently monitored for the following issues for this low-risk pregnancy and has Alpha thalassemia silent carrier; Supervision of high risk pregnancy, antepartum; Advanced maternal age in multigravida, first trimester; Obesity affecting pregnancy; and History of shoulder dystocia in prior pregnancy, currently pregnant on their problem list.  Patient reports heartburn.  Contractions: Not present. Vag. Bleeding: None.  Movement: Present. Denies leaking of fluid.   The following portions of the patient's history were reviewed and updated as appropriate: allergies, current medications, past family history, past medical history, past social history, past surgical history and problem list.   Objective:   Vitals:   02/15/23 0954  BP: 113/69  Pulse: (!) 101  Weight: 161 lb (73 kg)    Fetal Status: Fetal Heart Rate (bpm): 140 Fundal Height: 32 cm Movement: Present     General:  Alert, oriented and cooperative. Patient is in no acute distress.  Skin: Skin is warm and dry. No rash noted.   Cardiovascular: Normal heart rate noted  Respiratory: Normal respiratory effort, no problems with respiration noted  Abdomen: Soft, gravid, appropriate for gestational age.  Pain/Pressure: Present     Pelvic: Cervical exam deferred        Extremities: Normal range of motion.  Edema: None  Mental Status: Normal mood and affect. Normal behavior. Normal judgment and thought content.   Assessment and Plan:  Pregnancy: G3P1011 at [redacted]w[redacted]d 1. Supervision of high risk pregnancy, antepartum Continue routine prenatal care.  2. History of shoulder dystocia in prior pregnancy, currently pregnant Has been offered PLTCS--considering  3. Advanced maternal age in multigravida, first trimester LR NIPT  4. Gastroesophageal reflux disease without esophagitis PPI - omeprazole (PRILOSEC  OTC) 20 MG tablet; Take 1 tablet (20 mg total) by mouth daily.  Dispense: 30 tablet; Refill: 3  Preterm labor symptoms and general obstetric precautions including but not limited to vaginal bleeding, contractions, leaking of fluid and fetal movement were reviewed in detail with the patient. Please refer to After Visit Summary for other counseling recommendations.   Return in 2 weeks (on 03/01/2023).  Future Appointments  Date Time Provider Department Center  03/01/2023  9:35 AM Reva Bores, MD [facility]  03/15/2023  2:30 PM [place] Bing, MD [facility]  03/21/2023 10:45 AM [facility] NURSE [facility]  03/21/2023 11:00 AM [facility] US1 [facility]  03/22/2023  2:30 PM Reva Bores, MD [facility]  03/29/2023  3:30 PM Anyanwu, Jethro Bastos, MD [facility]  04/05/2023  3:30 PM Reva Bores, MD [facility]    Reva Bores, MD

## 2023-03-01 ENCOUNTER — Ambulatory Visit (INDEPENDENT_AMBULATORY_CARE_PROVIDER_SITE_OTHER): Payer: BC Managed Care – PPO | Admitting: Family Medicine

## 2023-03-01 VITALS — BP 109/72 | HR 88 | Wt 164.0 lb

## 2023-03-01 DIAGNOSIS — O0990 Supervision of high risk pregnancy, unspecified, unspecified trimester: Secondary | ICD-10-CM

## 2023-03-01 DIAGNOSIS — O09521 Supervision of elderly multigravida, first trimester: Secondary | ICD-10-CM

## 2023-03-01 DIAGNOSIS — O09299 Supervision of pregnancy with other poor reproductive or obstetric history, unspecified trimester: Secondary | ICD-10-CM

## 2023-03-01 NOTE — Progress Notes (Signed)
CC: Questions about size and delivery

## 2023-03-01 NOTE — Progress Notes (Signed)
   PRENATAL VISIT NOTE  Subjective:  Anne Long is a 37 y.o. G3P1011 at [redacted]w[redacted]d being seen today for ongoing prenatal care.  She is currently monitored for the following issues for this high-risk pregnancy and has Alpha thalassemia silent carrier; Supervision of high risk pregnancy, antepartum; Advanced maternal age in multigravida, first trimester; Obesity affecting pregnancy; and History of shoulder dystocia in prior pregnancy, currently pregnant on their problem list.  Patient reports no complaints.  Contractions: Not present. Vag. Bleeding: None.  Movement: Present. Denies leaking of fluid.   The following portions of the patient's history were reviewed and updated as appropriate: allergies, current medications, past family history, past medical history, past social history, past surgical history and problem list.   Objective:   Vitals:   03/01/23 0951  BP: 109/72  Pulse: 88  Weight: 164 lb (74.4 kg)    Fetal Status: Fetal Heart Rate (bpm): 154 Fundal Height: 35 cm Movement: Present     General:  Alert, oriented and cooperative. Patient is in no acute distress.  Skin: Skin is warm and dry. No rash noted.   Cardiovascular: Normal heart rate noted  Respiratory: Normal respiratory effort, no problems with respiration noted  Abdomen: Soft, gravid, appropriate for gestational age.  Pain/Pressure: Absent     Pelvic: Cervical exam deferred        Extremities: Normal range of motion.  Edema: None  Mental Status: Normal mood and affect. Normal behavior. Normal judgment and thought content.   Assessment and Plan:  Pregnancy: G3P1011 at [redacted]w[redacted]d 1. Supervision of high risk pregnancy, antepartum Continue routine prenatal care.  2. History of shoulder dystocia in prior pregnancy, currently pregnant Discussed options. She strongly desires attempt at labor with C-section if labor stalls. Re-reviewed risks of repeat Shoulder dystocia and potential outcomes, including broken bones, nerve  damage. Consider IOL @ 39 weeks if not in labor prior to this.  3. Advanced maternal age in multigravida, first trimester LR NIPT  Preterm labor symptoms and general obstetric precautions including but not limited to vaginal bleeding, contractions, leaking of fluid and fetal movement were reviewed in detail with the patient. Please refer to After Visit Summary for other counseling recommendations.   Return in 2 weeks (on 03/15/2023).  Future Appointments  Date Time Provider Department Center  03/15/2023  2:30 PM Ashley Bing, MD [facility]  03/21/2023 10:45 AM [facility] NURSE [facility]  03/21/2023 11:00 AM [facility] US1 [facility]  03/22/2023  2:30 PM Reva Bores, MD [facility]  03/29/2023  3:30 PM Anyanwu, Jethro Bastos, MD [facility]  04/05/2023  3:30 PM Reva Bores, MD [facility]    Reva Bores, MD

## 2023-03-15 ENCOUNTER — Other Ambulatory Visit (HOSPITAL_COMMUNITY)
Admission: RE | Admit: 2023-03-15 | Discharge: 2023-03-15 | Disposition: A | Discharge status: Home / Self Care | Payer: BC Managed Care – PPO | Source: Ambulatory Visit | Attending: Obstetrics and Gynecology | Admitting: Obstetrics and Gynecology

## 2023-03-15 ENCOUNTER — Ambulatory Visit (INDEPENDENT_AMBULATORY_CARE_PROVIDER_SITE_OTHER): Payer: BC Managed Care – PPO | Admitting: Obstetrics and Gynecology

## 2023-03-15 VITALS — BP 108/69 | HR 92 | Wt 163.0 lb

## 2023-03-15 DIAGNOSIS — Z3A36 36 weeks gestation of pregnancy: Secondary | ICD-10-CM | POA: Diagnosis present

## 2023-03-15 DIAGNOSIS — Z3483 Encounter for supervision of other normal pregnancy, third trimester: Secondary | ICD-10-CM | POA: Insufficient documentation

## 2023-03-15 DIAGNOSIS — O09521 Supervision of elderly multigravida, first trimester: Secondary | ICD-10-CM

## 2023-03-15 DIAGNOSIS — Z6831 Body mass index (BMI) 31.0-31.9, adult: Secondary | ICD-10-CM | POA: Insufficient documentation

## 2023-03-15 DIAGNOSIS — O09299 Supervision of pregnancy with other poor reproductive or obstetric history, unspecified trimester: Secondary | ICD-10-CM

## 2023-03-15 DIAGNOSIS — O99213 Obesity complicating pregnancy, third trimester: Secondary | ICD-10-CM

## 2023-03-15 DIAGNOSIS — O3663X0 Maternal care for excessive fetal growth, third trimester, not applicable or unspecified: Secondary | ICD-10-CM | POA: Insufficient documentation

## 2023-03-15 NOTE — Progress Notes (Signed)
   PRENATAL VISIT NOTE  Subjective:  Anne Long is a 37 y.o. G3P1011 at [redacted]w[redacted]d being seen today for ongoing prenatal care.  She is currently monitored for the following issues for this high-risk pregnancy and has Alpha thalassemia silent carrier; Supervision of high risk pregnancy, antepartum; Advanced maternal age in multigravida, first trimester; Obesity affecting pregnancy; History of shoulder dystocia in prior pregnancy, currently pregnant; Excessive fetal growth affecting management of mother in third trimester, antepartum; and BMI 31.0-31.9,adult on their problem list.  Patient reports no complaints.  Contractions: Irregular. Vag. Bleeding: None.  Movement: Present. Denies leaking of fluid.   The following portions of the patient's history were reviewed and updated as appropriate: allergies, current medications, past family history, past medical history, past social history, past surgical history and problem list.   Objective:   Vitals:   03/15/23 1440  BP: 108/69  Pulse: 92  Weight: 163 lb (73.9 kg)    Fetal Status: Fetal Heart Rate (bpm): 130   Movement: Present     General:  Alert, oriented and cooperative. Patient is in no acute distress.  Skin: Skin is warm and dry. No rash noted.   Cardiovascular: Normal heart rate noted  Respiratory: Normal respiratory effort, no problems with respiration noted  Abdomen: Soft, gravid, appropriate for gestational age.  Pain/Pressure: Absent     Pelvic: Cervical exam deferred        Extremities: Normal range of motion.  Edema: None  Mental Status: Normal mood and affect. Normal behavior. Normal judgment and thought content.   Assessment and Plan:  Pregnancy: G3P1011 at [redacted]w[redacted]d 1. Excessive fetal growth affecting management of pregnancy in third trimester, single or unspecified fetus See below S/p normal GTT  2. [redacted] weeks gestation of pregnancy - Cervicovaginal ancillary only - Strep Gp B NAA  3. History of shoulder dystocia in  prior pregnancy, currently pregnant Reviewed delivery note from March 2023 and it was a 2 minute long dystocia at 39wks (uncomplicated pregnancy) with an SVD. Baby weighed 3770g and was the 64% at around 33wks. She has a f/u growth next week and was 99% with large AC at 31wks on may 28th. Mom also states that this baby feels bigger than her last child I told her that we have to assume that she'll have another SD with this delivery, and risks of an even worse dystocia and injury to both mom and baby. I told her that given this I recommend a primary c-section, especially if this baby is still tracking big at her next growth u/s Patient and husband to think about it and will follow up next visit  4. Obesity affecting pregnancy in third trimester, unspecified obesity type Weight table  5. BMI 31.0-31.9,adult  6. Advanced maternal age in multigravida, first trimester No issues  Preterm labor symptoms and general obstetric precautions including but not limited to vaginal bleeding, contractions, leaking of fluid and fetal movement were reviewed in detail with the patient. Please refer to After Visit Summary for other counseling recommendations.   No follow-ups on file.  Future Appointments  Date Time Provider Department Center  03/21/2023 10:45 AM [facility] NURSE [facility]  03/21/2023 11:00 AM [facility] US1 [facility]  03/22/2023  2:30 PM Reva Bores, MD [facility]  03/29/2023  3:30 PM Anyanwu, Jethro Bastos, MD [facility]  04/05/2023  3:30 PM Reva Bores, MD [facility]    [place] Bing, MD

## 2023-03-15 NOTE — Progress Notes (Signed)
CC: ROB  Denies any concerns    

## 2023-03-17 LAB — STREP GP B NAA: Strep Gp B NAA: NEGATIVE

## 2023-03-20 LAB — CERVICOVAGINAL ANCILLARY ONLY
Chlamydia: NEGATIVE
Comment: NEGATIVE
Comment: NORMAL
Neisseria Gonorrhea: NEGATIVE

## 2023-03-21 ENCOUNTER — Ambulatory Visit: Payer: BC Managed Care – PPO | Attending: Maternal & Fetal Medicine

## 2023-03-21 ENCOUNTER — Ambulatory Visit: Payer: BC Managed Care – PPO | Admitting: *Deleted

## 2023-03-21 VITALS — BP 116/68 | HR 90

## 2023-03-21 DIAGNOSIS — O09523 Supervision of elderly multigravida, third trimester: Secondary | ICD-10-CM | POA: Diagnosis not present

## 2023-03-21 DIAGNOSIS — D563 Thalassemia minor: Secondary | ICD-10-CM | POA: Diagnosis present

## 2023-03-21 DIAGNOSIS — O09299 Supervision of pregnancy with other poor reproductive or obstetric history, unspecified trimester: Secondary | ICD-10-CM

## 2023-03-21 DIAGNOSIS — O99213 Obesity complicating pregnancy, third trimester: Secondary | ICD-10-CM | POA: Diagnosis present

## 2023-03-21 DIAGNOSIS — O09521 Supervision of elderly multigravida, first trimester: Secondary | ICD-10-CM | POA: Diagnosis present

## 2023-03-21 DIAGNOSIS — O0990 Supervision of high risk pregnancy, unspecified, unspecified trimester: Secondary | ICD-10-CM

## 2023-03-21 DIAGNOSIS — E669 Obesity, unspecified: Secondary | ICD-10-CM

## 2023-03-21 DIAGNOSIS — D259 Leiomyoma of uterus, unspecified: Secondary | ICD-10-CM

## 2023-03-21 DIAGNOSIS — Z3689 Encounter for other specified antenatal screening: Secondary | ICD-10-CM | POA: Insufficient documentation

## 2023-03-21 DIAGNOSIS — O3412 Maternal care for benign tumor of corpus uteri, second trimester: Secondary | ICD-10-CM

## 2023-03-21 DIAGNOSIS — Z3A37 37 weeks gestation of pregnancy: Secondary | ICD-10-CM

## 2023-03-21 DIAGNOSIS — O99013 Anemia complicating pregnancy, third trimester: Secondary | ICD-10-CM | POA: Diagnosis not present

## 2023-03-21 DIAGNOSIS — O09293 Supervision of pregnancy with other poor reproductive or obstetric history, third trimester: Secondary | ICD-10-CM | POA: Diagnosis not present

## 2023-03-21 DIAGNOSIS — O3663X0 Maternal care for excessive fetal growth, third trimester, not applicable or unspecified: Secondary | ICD-10-CM

## 2023-03-22 ENCOUNTER — Ambulatory Visit (INDEPENDENT_AMBULATORY_CARE_PROVIDER_SITE_OTHER): Payer: BC Managed Care – PPO | Admitting: Family Medicine

## 2023-03-22 VITALS — BP 107/69 | HR 101 | Wt 165.6 lb

## 2023-03-22 DIAGNOSIS — O09299 Supervision of pregnancy with other poor reproductive or obstetric history, unspecified trimester: Secondary | ICD-10-CM

## 2023-03-22 DIAGNOSIS — O0990 Supervision of high risk pregnancy, unspecified, unspecified trimester: Secondary | ICD-10-CM

## 2023-03-22 DIAGNOSIS — O3663X0 Maternal care for excessive fetal growth, third trimester, not applicable or unspecified: Secondary | ICD-10-CM

## 2023-03-22 DIAGNOSIS — O09521 Supervision of elderly multigravida, first trimester: Secondary | ICD-10-CM

## 2023-03-22 NOTE — Progress Notes (Signed)
    PRENATAL VISIT NOTE  Subjective:  Anne Long is a 37 y.o. G3P1011 at [redacted]w[redacted]d being seen today for ongoing prenatal care.  She is currently monitored for the following issues for this high-risk pregnancy and has Alpha thalassemia silent carrier; Supervision of high risk pregnancy, antepartum; Advanced maternal age in multigravida, first trimester; Obesity affecting pregnancy; History of shoulder dystocia in prior pregnancy, currently pregnant; Excessive fetal growth affecting management of mother in third trimester, antepartum; and BMI 31.0-31.9,adult on their problem list.  Patient reports no complaints.  Contractions: Irregular. Vag. Bleeding: None.  Movement: Present. Denies leaking of fluid.   The following portions of the patient's history were reviewed and updated as appropriate: allergies, current medications, past family history, past medical history, past social history, past surgical history and problem list.   Objective:   Vitals:   03/22/23 1438  BP: 107/69  Pulse: (!) 101  Weight: 165 lb 9.6 oz (75.1 kg)    Fetal Status: Fetal Heart Rate (bpm): 143 Fundal Height: 38 cm Movement: Present  Presentation: Vertex  General:  Alert, oriented and cooperative. Patient is in no acute distress.  Skin: Skin is warm and dry. No rash noted.   Cardiovascular: Normal heart rate noted  Respiratory: Normal respiratory effort, no problems with respiration noted  Abdomen: Soft, gravid, appropriate for gestational age.  Pain/Pressure: Absent     Pelvic: Cervical exam deferred Dilation: 1 Effacement (%): 20 Station: -2  Extremities: Normal range of motion.  Edema: None  Mental Status: Normal mood and affect. Normal behavior. Normal judgment and thought content.   Assessment and Plan:  Pregnancy: G3P1011 at [redacted]w[redacted]d 1. Supervision of high risk pregnancy, antepartum   2. History of shoulder dystocia in prior pregnancy, currently pregnant Discussed EFW > 99% and weight of baby. Has h/o  SD, and knows we recommend C-section for prevention. Reviewed risks of SD that results in brachial plexus injury or brain damage or fetal death. Declines IOL. She desires to monitor labor curve and plan c-section if falls off labor curve. No instrumented delivery.  3. Excessive fetal growth affecting management of pregnancy in third trimester, single or unspecified fetus   4. Advanced maternal age in multigravida, first trimester LR NIPT  Preterm labor symptoms and general obstetric precautions including but not limited to vaginal bleeding, contractions, leaking of fluid and fetal movement were reviewed in detail with the patient. Please refer to After Visit Summary for other counseling recommendations.   Return in 1 week (on 03/29/2023).  Future Appointments  Date Time Provider Department Center  03/29/2023  3:30 PM Anyanwu, Jethro Bastos, MD [facility]  04/05/2023  3:30 PM Reva Bores, MD [facility]    Reva Bores, MD

## 2023-03-22 NOTE — Progress Notes (Signed)
ROB: denies any concerns   

## 2023-03-29 ENCOUNTER — Ambulatory Visit: Payer: BC Managed Care – PPO | Admitting: Obstetrics & Gynecology

## 2023-03-29 ENCOUNTER — Encounter: Payer: Self-pay | Admitting: Obstetrics & Gynecology

## 2023-03-29 VITALS — BP 111/71 | HR 93 | Wt 165.4 lb

## 2023-03-29 DIAGNOSIS — O0990 Supervision of high risk pregnancy, unspecified, unspecified trimester: Secondary | ICD-10-CM

## 2023-03-29 DIAGNOSIS — Z3A38 38 weeks gestation of pregnancy: Secondary | ICD-10-CM

## 2023-03-29 DIAGNOSIS — O09299 Supervision of pregnancy with other poor reproductive or obstetric history, unspecified trimester: Secondary | ICD-10-CM

## 2023-03-29 DIAGNOSIS — O3663X0 Maternal care for excessive fetal growth, third trimester, not applicable or unspecified: Secondary | ICD-10-CM

## 2023-03-29 DIAGNOSIS — O09523 Supervision of elderly multigravida, third trimester: Secondary | ICD-10-CM

## 2023-03-29 NOTE — Patient Instructions (Signed)

## 2023-03-29 NOTE — Progress Notes (Signed)
PRENATAL VISIT NOTE  Subjective:  Anne Long is a 37 y.o. G3P1011 at [redacted]w[redacted]d being seen today for ongoing prenatal care.  She is currently monitored for the following issues for this high-risk pregnancy and has Alpha thalassemia silent carrier; Supervision of high risk pregnancy, antepartum; Advanced maternal age in multigravida, third trimester; Obesity affecting pregnancy; History of shoulder dystocia in prior pregnancy, currently pregnant; Excessive fetal growth affecting management of mother in third trimester, antepartum; and BMI 31.0-31.9,adult on their problem list.  Patient reports no complaints.  Contractions: Irregular. Vag. Bleeding: None.  Movement: Present. Denies leaking of fluid.   The following portions of the patient's history were reviewed and updated as appropriate: allergies, current medications, past family history, past medical history, past social history, past surgical history and problem list.   Objective:   Vitals:   03/29/23 1553  BP: 111/71  Pulse: 93  Weight: 165 lb 6.4 oz (75 kg)   Fetal Status: Fetal Heart Rate (bpm): 144   Movement: Present     General:  Alert, oriented and cooperative. Patient is in no acute distress.  Skin: Skin is warm and dry. No rash noted.   Cardiovascular: Normal heart rate noted  Respiratory: Normal respiratory effort, no problems with respiration noted  Abdomen: Soft, gravid, appropriate for gestational age.  Pain/Pressure: Present     Pelvic: Cervical exam deferred        Extremities: Normal range of motion.  Edema: None  Mental Status: Normal mood and affect. Normal behavior. Normal judgment and thought content.   Imaging: [place] MFM OB FOLLOW UP  Result Date: 03/21/2023 ----------------------------------------------------------------------  OBSTETRICS REPORT                    (Corrected Final 03/21/2023 11:56 am) ---------------------------------------------------------------------- Patient Info  ID #:       [id number]                           D.O.B.:  26-Dec-1985 (37 yrs)  Name:       Anne Long               Visit Date: 03/21/2023 10:51 am ---------------------------------------------------------------------- Performed By  Attending:        Lin Landsman      Ref. Address:     [address]                    [address]                                                             [address]  Performed By:     Reinaldo Raddle            [facility]  Referred By:      [facility] ---------------------------------------------------------------------- Orders  #  Description                           Code        Ordered By  1  [place] MFM OB FOLLOW UP                   16109.60    Lin Landsman ----------------------------------------------------------------------  #  Order #                     Accession #                Episode #  1  [id number]                   [id number]                 [id number] ---------------------------------------------------------------------- Indications  Advanced maternal age multigravida 10+,        O86.523  third trimester  Obesity complicating pregnancy, third          O99.213  trimester ( BMI 31 )  Genetic carrier (Silent carrier Alpha-         Z14.8  Thalassemia) (Partner is also silent carrier)  Uterine fibroids affecting pregnancy in        O34.12, D25.9  second trimester, antepartum  Poor obstetrical history (previous shoulder    O09.299  dystocia)  LR NIPS/Neg AFP  [redacted] weeks gestation of pregnancy                Z3A.37 ---------------------------------------------------------------------- Fetal Evaluation  Num Of Fetuses:         1  Fetal Heart Rate(bpm):  133  Cardiac Activity:       Observed  Presentation:            Cephalic  Placenta:               Anterior  P. Cord Insertion:      Previously visualized  Amniotic Fluid  AFI FV:      Within normal limits  AFI Sum(cm)     %Tile       Largest Pocket(cm)  20.74           80          7.18  RUQ(cm)       RLQ(cm)       LUQ(cm)        LLQ(cm)  7.18          6.42          1.34           5.8 ---------------------------------------------------------------------- Biometry  BPD:      92.4  mm     G. Age:  37w 4d         79  %    CI:        77.03   %    70 - 86                                                          FL/HC:      20.2   %    20.8 - 22.6  HC:      333.4  mm     G. Age:  38w 1d         50  %    HC/AC:      0.85        0.92 - 1.05  AC:      394.2  mm     G. Age:  43w 3d       > 99  %    FL/BPD:     73.1   %    71 - 87  FL:       67.5  mm     G. Age:  34w 5d          5  %    FL/AC:      17.1   %    20 - 24  HUM:      59.2  mm     G. Age:  34w 2d         19  %  LV:        3.7  mm  Est. FW:    4111  gm      9 lb 1 oz   > 99  % ---------------------------------------------------------------------- OB History  Gravidity:    3         Term:   1        Prem:   0        SAB:   1  TOP:          0       Ectopic:  0        Living: 1 ---------------------------------------------------------------------- Gestational Age  U/S Today:     38w 3d                                        EDD:   04/01/23  Best:          37w 0d     Det. By:  U/S C R L  (09/28/22)    EDD:   04/11/23 ---------------------------------------------------------------------- Anatomy  Cranium:               Appears normal         Aortic Arch:            Previously seen  Cavum:                 Previously seen        Ductal Arch:            Previously seen  Ventricles:            Appears normal         Diaphragm:  Appears normal  Choroid Plexus:        Previously seen        Stomach:                Appears normal, left                                                                        sided  Cerebellum:             Previously seen        Abdomen:                Appears normal  Posterior Fossa:       Previously seen        Abdominal Wall:         Previously seen  Nuchal Fold:           Previously seen        Cord Vessels:           Previously seen  Face:                  Orbits and profile     Kidneys:                Appear normal                         previously seen  Lips:                  Previously seen        Bladder:                Appears normal  Thoracic:              Appears normal         Spine:                  Previously seen  Heart:                 Previously seen        Upper Extremities:      Previously seen  RVOT:                  Previously seen        Lower Extremities:      Previously seen  LVOT:                  Previously seen  Other:  SVC/IVC, 3VV, 3VTV previously visualized. Nasal bone, lenses,          maxilla, mandible and falx previously visualized. Hands and feet          previously visualized. Heels and 5th digit previously seen. Female          gender. ---------------------------------------------------------------------- Cervix Uterus Adnexa  Cervix  Not visualized (advanced GA >24wks)  Uterus  Single fibroid noted, see table below.  Right Ovary  Size(cm)     2.87   x   1.79   x  1.34      Vol(ml): 3.6  Within normal limits.  Left Ovary  Not visualized.  Cul De Sac  No free fluid seen.  Adnexa  No adnexal mass visualized ---------------------------------------------------------------------- Myomas  Site                     L(cm)      W(cm)      D(cm)       Location  Anterior                 2.57       1.71       2.15 ----------------------------------------------------------------------  Blood Flow                  RI       PI       Comments ---------------------------------------------------------------------- Impression  Follow up growth due to suspected large for gestational age.  Normal interval growth with measurements consistent with  LGA, EFW and AC 99% (4111g non-DM).  Good fetal movement  and amniotic fluid volume  I discussed with Ms. Delia today's' visit. We discussed the  risk and benefits of vaginal delivery vs. Cesarean delivery.  We discussed the recurrence risk of shoulder dystocia with  temporary or permanent nerve damage.  We discussed that we should offer a cesarean delivery but  she may opt to proceed with a IOL with strict triggers for a  cesarean delivery. We discussed that this ultimately the  decision of the delivery provider.  We recommend caution for heroic procedures such as VAVD  or FAVD. We discussed staying close to the labor curve.  She and her husband shared an exacerbating factor was that  in her prior pregnancy she pushed for a day and a half  assuming that she was fully dilated but wasnt. Once she was  fully dilated she delivered several hours after.  They expressed an understanding of todays conversation and  will discuss further with her provider. ---------------------------------------------------------------------- Recommendations  Follow up growth as clinically indicated. ----------------------------------------------------------------------                   Lin Landsman, MD Electronically Signed Corrected Final Report  03/21/2023 11:56 am ----------------------------------------------------------------------   Assessment and Plan:  Pregnancy: G3P1011 at 106w1d 1. Excessive fetal growth affecting management of pregnancy in third trimester, single or unspecified fetus 2. History of shoulder dystocia in prior pregnancy, currently pregnant EFW on 03/20/13 was 4111 g (99%), AC 99%.  She was extensively counseled by Dr. Grace Bushy (MFM), offered outright cesarean section.  Patient does not want this, wants to try vaginal delivery. Explained that the EFW is already larger than the actual weight of the last infant (3770g).  Risks of repeat shoulder dystocia which could be worse this time emphasized.  Discussed that infant can have temporary or permanent nerve injuries,  fractures, and death is a real risk. Maternal injury can also occur.  She verbalized understanding of these risks, wants trial of labor.  Given size of her infant, IOL recommended at 39 weeks and she agreed to this.  I called L&D and the induction spots were full until [redacted]w[redacted]d, this was scheduled on 04/07/23 morning but a note was made to call her in on 04/04/23 if a spot opens up.   She also knows there will be no attempt of an operative vaginal delivery.   She and her husband were told to continue to think about these options, can change mind at any time and discuss with next provider during her scheduled 04/05/23 visit.   3. Advanced maternal age in multigravida, third trimester 4. [redacted] weeks gestation of pregnancy  5. Supervision of high risk pregnancy, antepartum Labor symptoms and general obstetric precautions including but not limited to vaginal bleeding, contractions, leaking of fluid and fetal movement were reviewed in detail with the patient. Please refer to After Visit Summary for other counseling recommendations.   Return for OB visits as scheduled.  Future Appointments  Date Time Provider Department Center  04/05/2023  3:30 PM Reva Bores, MD [facility]  04/07/2023  6:30 AM [facility] SCHED ROOM [facility] None    Jaynie Collins, MD

## 2023-03-30 NOTE — Telephone Encounter (Signed)
     OB/GYN Physician Phone Call Documentation  I had a phone conversation with CATHARINE KETTLEWELL about her decision to proceed with primary cesarean section, given concern about her LGA infant, history of previous cesarean section.  I verified that this is her decision now, as she decided yesterday she had wanted to proceed with IOL instead (see my 03/29/23 note).  She said she discussed this with her husband, and they want what is safest for their baby.  She understands that the estimated fetal weight is just estimated, the actual weight could be less or more, but she does not want to risk having a shoulder dystocia event again that can result in harm to her baby.  The risks of cesarean section were discussed with the patient including but were not limited to: bleeding which may require transfusion or reoperation; infection which may require antibiotics; injury to bowel, bladder, ureters or other surrounding organs; injury to the fetus; need for additional procedures including hysterectomy in the event of a life-threatening hemorrhage; formation of adhesions; placental abnormalities with subsequent pregnancies; incisional problems; thromboembolic phenomenon and other postoperative/anesthesia complications.  The patient concurred with the proposed plan, wants to proceed with this procedure.  Cesarean section scheduled for 04/05/23 at 1215 (patient's choice, she was offered 04/04/23 at [redacted]w[redacted]d but she declined) with Dr. Ashok Pall.  Preoperative instructions reviewed, she was told to expect call from [facility] regarding further instructions and for the date of her preoperative testing.   Preoperative orders were signed and held; Dr. Ashok Pall was notified of this addition to the schedule.  [facility] Surgical Scheduling Team was also notified (I scheduled the Cesarean while at [facility] with the help of the [facility] OB OR Charge RN).  Labor precautions and fetal movement precautions reviewed with patient.   Jaynie Collins, MD,  FACOG Obstetrician & Gynecologist, [facility] for [employer], [facility]

## 2023-03-31 ENCOUNTER — Encounter (HOSPITAL_COMMUNITY): Payer: Self-pay

## 2023-03-31 NOTE — Patient Instructions (Signed)
SHAVONA GUNDERMAN  03/31/2023   Your procedure is scheduled on:  04/05/2023  Arrive at 1030 at Entrance C on [address] at [facility]  and [employer]. You are invited to use the FREE valet parking or use the Visitor's parking deck.  Pick up the phone at the desk and dial [phone].  Call this number if you have problems the morning of surgery: [phone]  Remember:   Do not eat food:(After Midnight) Desps de medianoche.  Do not drink clear liquids: (After Midnight) Desps de medianoche.  Take these medicines the morning of surgery with A SIP OF WATER:  none   Do not wear jewelry, make-up or nail polish.  Do not wear lotions, powders, or perfumes. Do not wear deodorant.  Do not shave 48 hours prior to surgery.  Do not bring valuables to the hospital.  [facility] is not   responsible for any belongings or valuables brought to the hospital.  Contacts, dentures or bridgework may not be worn into surgery.  Leave suitcase in the car. After surgery it may be brought to your room.  For patients admitted to the hospital, checkout time is 11:00 AM the day of              discharge.      Please read over the following fact sheets that you were given:     Preparing for Surgery

## 2023-04-03 ENCOUNTER — Encounter (HOSPITAL_COMMUNITY)
Admission: RE | Admit: 2023-04-03 | Discharge: 2023-04-03 | Disposition: A | Discharge status: Home / Self Care | Payer: BC Managed Care – PPO | Source: Ambulatory Visit | Attending: Family Medicine | Admitting: Family Medicine

## 2023-04-03 DIAGNOSIS — O3663X0 Maternal care for excessive fetal growth, third trimester, not applicable or unspecified: Secondary | ICD-10-CM | POA: Insufficient documentation

## 2023-04-03 DIAGNOSIS — O09299 Supervision of pregnancy with other poor reproductive or obstetric history, unspecified trimester: Secondary | ICD-10-CM | POA: Insufficient documentation

## 2023-04-03 DIAGNOSIS — Z01812 Encounter for preprocedural laboratory examination: Secondary | ICD-10-CM | POA: Insufficient documentation

## 2023-04-03 LAB — CBC
HCT: 31.1 % — ABNORMAL LOW (ref 36.0–46.0)
Hemoglobin: 9.6 g/dL — ABNORMAL LOW (ref 12.0–15.0)
MCH: 24 pg — ABNORMAL LOW (ref 26.0–34.0)
MCHC: 30.9 g/dL (ref 30.0–36.0)
MCV: 77.8 fL — ABNORMAL LOW (ref 80.0–100.0)
Platelets: 232 10*3/uL (ref 150–400)
RBC: 4 MIL/uL (ref 3.87–5.11)
RDW: 16.2 % — ABNORMAL HIGH (ref 11.5–15.5)
WBC: 4.9 10*3/uL (ref 4.0–10.5)
nRBC: 0 % (ref 0.0–0.2)

## 2023-04-03 LAB — TYPE AND SCREEN
ABO/RH(D): A POS
Antibody Screen: NEGATIVE

## 2023-04-03 LAB — RPR: RPR Ser Ql: NONREACTIVE

## 2023-04-04 NOTE — H&P (Signed)
Obstetric Preoperative History and Physical  Anne Long is a 37 y.o. G3P1011 with IUP at [redacted]w[redacted]d presenting for scheduled cesarean section.  Reports good fetal movement, no bleeding, no contractions, no leaking of fluid.  No acute preoperative concerns.    Cesarean Section Indication: macrosomia, hx of shoulder dystocia  Prenatal Course Source of Care: [facility]  with onset of care at 14 weeks Pregnancy complications or risks: Patient Active Problem List   Diagnosis Date Noted   History of shoulder dystocia in prior pregnancy, currently pregnant in third trimester 04/05/2023   Excessive fetal growth affecting management of mother in third trimester, antepartum 03/15/2023   BMI 31.0-31.9,adult 03/15/2023   History of shoulder dystocia in prior pregnancy, currently pregnant 10/12/2022   Supervision of high risk pregnancy, antepartum 09/28/2022   Advanced maternal age in multigravida, third trimester 09/28/2022   Obesity affecting pregnancy 09/28/2022   Anemia affecting pregnancy 09/02/2021   Alpha thalassemia silent carrier 05/18/2021   She plans to breastfeed She desires IUD for postpartum contraception.   Prenatal labs and studies: ABO, Rh: --/--/A POS (07/22 1049) Antibody: NEG (07/22 1049) Rubella: 2.24 (01/17 1557) RPR: NON REACTIVE (07/22 1048)  HBsAg: Negative (01/17 1557)  HIV: Non Reactive (05/08 0843)  WJX:BJYNWGNF/-- (07/03 1544) 1 hr Glucola  125 Genetic screening  normal-silent carrier of alpha thal Anatomy US normal  Prenatal Transfer Tool  Maternal Diabetes: No Genetic Screening: Normal Maternal Ultrasounds/Referrals: Normal Fetal Ultrasounds or other Referrals:  None Maternal Substance Abuse:  No Significant Maternal Medications:  None Significant Maternal Lab Results: Group B Strep negative  Past Medical History:  Diagnosis Date   Alpha thalassemia silent carrier 05/18/2021   FOB also silent carrier S/p GC consult   Dysplasia of cervix, high grade CIN 2  03/19/2017   Seen on colposcopy after ASCUS +HPV pap smear [ ]  Cryotherapy   Fibroid    History of cryosurgery of cervix 05/03/2021   2020: neg cytology and negative hpv 2018 for cin 2   Vaginal Pap smear, abnormal     Past Surgical History:  Procedure Laterality Date   GYNECOLOGIC CRYOSURGERY      OB History  Gravida Para Term Preterm AB Living  3 1 1  0 1 1  SAB IAB Ectopic Multiple Live Births  1 0 0 0 1    # Outcome Date GA Lbr Len/2nd Weight Sex Type Anes PTL Lv  3 Current           2 Term 11/20/21 [redacted]w[redacted]d 40:50 / 01:49 3570 g F Vag-Spont EPI  LIV  1 SAB 12/04/20 [redacted]w[redacted]d            Birth Comments: Recent COVID infection    Social History   Socioeconomic History   Marital status: Married    Spouse name: Not on file   Number of children: Not on file   Years of education: Not on file   Highest education level: Not on file  Occupational History   Occupation: Magazine features editor: [employer]  Tobacco Use   Smoking status: Never   Smokeless tobacco: Never  Vaping Use   Vaping status: Never Used  Substance and Sexual Activity   Alcohol use: No   Drug use: No   Sexual activity: Yes    Birth control/protection: None  Other Topics Concern   Not on file  Social History Narrative   Not on file   Social Determinants of Health   Financial Resource Strain: Not on file  Food Insecurity: Not on file  Transportation Needs: Not on file  Physical Activity: Not on file  Stress: Not on file  Social Connections: Not on file    Family History  Problem Relation Age of Onset   Hypertension Mother    Heart disease Father    Heart disease Paternal Uncle    Ovarian cancer Maternal Grandmother 73   Diabetes Paternal Grandmother    Asthma Neg Hx    Cancer Neg Hx     Medications Prior to Admission  Medication Sig Dispense Refill Last Dose   aspirin 81 MG chewable tablet Chew 1 tablet (81 mg total) by mouth daily. 90 tablet 3    ferrous sulfate 325 (65 FE) MG EC  tablet Take 1 tablet (325 mg total) by mouth every other day. (Patient taking differently: Take 325 mg by mouth daily with breakfast.) 45 tablet 3    omeprazole (PRILOSEC OTC) 20 MG tablet Take 1 tablet (20 mg total) by mouth daily. 30 tablet 3    Prenatal Vit-Fe Fumarate-FA (PRENATAL VITAMINS PO) Take 1 tablet by mouth daily.       Allergies  Allergen Reactions   Other Swelling    Shrimp    Review of Systems: Pertinent items noted in HPI and remainder of comprehensive ROS otherwise negative.  Physical Exam: BP 113/77   Pulse 95   Temp 99.3 F (37.4 C) (Oral)   Resp 15   Ht 4\' 11"  (1.499 m)   Wt 75.5 kg   LMP  (LMP Unknown)   SpO2 100%   BMI 33.61 kg/m  FHR by Doppler: 145 bpm General: Appears well, no acute distress. Age appropriate. Cardiac: Regular rate noted.  Respiratory: normal effort Abdomen: Gravid. Pelvic: Deferred Extremities: No edema or cyanosis. Skin: Warm and dry, no rashes noted Neuro: alert and oriented, no focal deficits Psych: normal affect  Pertinent Labs/Studies:   Results for orders placed or performed during the hospital encounter of 04/03/23 (from the past 72 hour(s))  CBC     Status: Abnormal   Collection Time: 04/03/23 10:48 AM  Result Value Ref Range   WBC 4.9 4.0 - 10.5 K/uL   RBC 4.00 3.87 - 5.11 MIL/uL   Hemoglobin 9.6 (L) 12.0 - 15.0 g/dL   HCT 65.7 (L) 84.6 - 96.2 %   MCV 77.8 (L) 80.0 - 100.0 fL   MCH 24.0 (L) 26.0 - 34.0 pg   MCHC 30.9 30.0 - 36.0 g/dL   RDW 95.2 (H) 84.1 - 32.4 %   Platelets 232 150 - 400 K/uL   nRBC 0.0 0.0 - 0.2 %    Comment: Performed at [facility], [address], [place], [place] [address]  RPR     Status: None   Collection Time: 04/03/23 10:48 AM  Result Value Ref Range   RPR Ser Ql NON REACTIVE NON REACTIVE    Comment: Performed at [facility], [address] [place], [place] [address]  Type and screen [facility]     Status: None   Collection Time: 04/03/23 10:49 AM   Result Value Ref Range   ABO/RH(D) A POS    Antibody Screen NEG    Sample Expiration      04/06/2023,2359 Performed at [facility], [address], [place], [place] [address]     Assessment and Plan: Anne Long is a 37 y.o. G3P1011 at [redacted]w[redacted]d being admitted for scheduled cesarean section for suspected LGA with a  prior history of shoulder dystocia with a 3.57 kg infant. Extrapolated EFW for this infant is 4.7 kg. We engaged in extensive shared decision making. I explained that in general given absence of gdm the cut-off in terms of relative morbidity is 5 kg, but given history shoulder dystocia with a smaller-size infant, in this patient's case the risk of shoulder dystocia at 4.7 kg is likely higher than average. She affirms her desire to proceed with primary cesarean and has been counseled outpatient by MFM and her other OB providers.  The risks of cesarean section were discussed with the patient including but were not limited to: bleeding which may require transfusion or reoperation; infection which may require antibiotics; injury to bowel, bladder, ureters or other surrounding organs; injury to the fetus; need for additional procedures including hysterectomy in the event of a life-threatening hemorrhage; placental abnormalities wth subsequent pregnancies, incisional problems, thromboembolic phenomenon and other postoperative/anesthesia complications.  Patient has been NPO since midnight she will remain NPO for procedure. Anesthesia and OR aware.  Preoperative prophylactic antibiotics and SCDs ordered on call to the OR.  To OR when ready.  # Contraception: desires post-placental IUD, aware of increased risk of expulsion and undescended strings. Declines tubal sterilization.  # circ: yes if boy  # Feeding: breast  # ID: ancef for ppx  Shonna Chock, MD [facility] for [facility] 04/05/2023, 12:01 PM

## 2023-04-05 ENCOUNTER — Encounter (HOSPITAL_COMMUNITY): Payer: Self-pay | Admitting: Obstetrics and Gynecology

## 2023-04-05 ENCOUNTER — Encounter (HOSPITAL_COMMUNITY)
Admission: AD | Disposition: A | Discharge status: Home / Self Care | Payer: Self-pay | Source: Home / Self Care | Attending: Obstetrics and Gynecology

## 2023-04-05 ENCOUNTER — Inpatient Hospital Stay (HOSPITAL_COMMUNITY): Payer: BC Managed Care – PPO

## 2023-04-05 ENCOUNTER — Encounter: Payer: BC Managed Care – PPO | Admitting: Family Medicine

## 2023-04-05 ENCOUNTER — Inpatient Hospital Stay (HOSPITAL_COMMUNITY)
Admission: AD | Admit: 2023-04-05 | Discharge: 2023-04-08 | Disposition: A | Discharge status: Home / Self Care | Payer: BC Managed Care – PPO | Source: Home / Self Care | Attending: Obstetrics and Gynecology | Admitting: Obstetrics and Gynecology

## 2023-04-05 ENCOUNTER — Other Ambulatory Visit: Payer: Self-pay

## 2023-04-05 DIAGNOSIS — O09293 Supervision of pregnancy with other poor reproductive or obstetric history, third trimester: Secondary | ICD-10-CM

## 2023-04-05 DIAGNOSIS — O99019 Anemia complicating pregnancy, unspecified trimester: Secondary | ICD-10-CM | POA: Diagnosis present

## 2023-04-05 DIAGNOSIS — O09299 Supervision of pregnancy with other poor reproductive or obstetric history, unspecified trimester: Secondary | ICD-10-CM

## 2023-04-05 DIAGNOSIS — Z01812 Encounter for preprocedural laboratory examination: Secondary | ICD-10-CM | POA: Diagnosis not present

## 2023-04-05 DIAGNOSIS — O34211 Maternal care for low transverse scar from previous cesarean delivery: Secondary | ICD-10-CM | POA: Diagnosis not present

## 2023-04-05 DIAGNOSIS — O99214 Obesity complicating childbirth: Secondary | ICD-10-CM | POA: Diagnosis present

## 2023-04-05 DIAGNOSIS — Z148 Genetic carrier of other disease: Secondary | ICD-10-CM

## 2023-04-05 DIAGNOSIS — O09523 Supervision of elderly multigravida, third trimester: Secondary | ICD-10-CM | POA: Diagnosis not present

## 2023-04-05 DIAGNOSIS — O9902 Anemia complicating childbirth: Secondary | ICD-10-CM | POA: Diagnosis present

## 2023-04-05 DIAGNOSIS — O3663X0 Maternal care for excessive fetal growth, third trimester, not applicable or unspecified: Principal | ICD-10-CM | POA: Diagnosis present

## 2023-04-05 DIAGNOSIS — O99210 Obesity complicating pregnancy, unspecified trimester: Secondary | ICD-10-CM | POA: Diagnosis present

## 2023-04-05 DIAGNOSIS — Z7982 Long term (current) use of aspirin: Secondary | ICD-10-CM

## 2023-04-05 DIAGNOSIS — Z3A39 39 weeks gestation of pregnancy: Secondary | ICD-10-CM

## 2023-04-05 DIAGNOSIS — Z87410 Personal history of cervical dysplasia: Secondary | ICD-10-CM

## 2023-04-05 DIAGNOSIS — O09823 Supervision of pregnancy with history of in utero procedure during previous pregnancy, third trimester: Secondary | ICD-10-CM | POA: Diagnosis not present

## 2023-04-05 SURGERY — Surgical Case
Anesthesia: Spinal

## 2023-04-05 MED ORDER — DIBUCAINE (PERIANAL) 1 % EX OINT: 1.0000 | TOPICAL_OINTMENT | CUTANEOUS | Status: DC | PRN

## 2023-04-05 MED ORDER — LEVONORGESTREL 20 MCG/DAY IU IUD: 1.0000 | INTRAUTERINE_SYSTEM | Freq: Once | INTRAUTERINE | Status: DC

## 2023-04-05 MED ORDER — KETOROLAC TROMETHAMINE 30 MG/ML IJ SOLN
INTRAMUSCULAR | Status: AC
  Filled 2023-04-05: qty 1

## 2023-04-05 MED ORDER — OXYTOCIN-SODIUM CHLORIDE 30-0.9 UT/500ML-% IV SOLN
INTRAVENOUS | Status: AC
  Filled 2023-04-05: qty 500

## 2023-04-05 MED ORDER — SOD CITRATE-CITRIC ACID 500-334 MG/5ML PO SOLN: 30.0000 mL | ORAL | Status: DC

## 2023-04-05 MED ORDER — LACTATED RINGERS IV SOLN: INTRAVENOUS | Status: DC

## 2023-04-05 MED ORDER — MORPHINE SULFATE (PF) 0.5 MG/ML IJ SOLN
INTRAMUSCULAR | Status: DC | PRN
  Administered 2023-04-05: 150 ug via INTRATHECAL

## 2023-04-05 MED ORDER — DEXAMETHASONE SODIUM PHOSPHATE 10 MG/ML IJ SOLN
INTRAMUSCULAR | Status: DC | PRN
  Administered 2023-04-05: 8 mg via INTRAVENOUS

## 2023-04-05 MED ORDER — MORPHINE SULFATE (PF) 0.5 MG/ML IJ SOLN
INTRAMUSCULAR | Status: AC
  Filled 2023-04-05: qty 10

## 2023-04-05 MED ORDER — ACETAMINOPHEN 500 MG PO TABS
ORAL_TABLET | ORAL | Status: AC
  Filled 2023-04-05: qty 2

## 2023-04-05 MED ORDER — ACETAMINOPHEN 160 MG/5ML PO SOLN: 960.0000 mg | Freq: Once | ORAL | Status: AC

## 2023-04-05 MED ORDER — SCOPOLAMINE 1 MG/3DAYS TD PT72
MEDICATED_PATCH | TRANSDERMAL | Status: AC
  Filled 2023-04-05: qty 1

## 2023-04-05 MED ORDER — SODIUM CHLORIDE 0.9 % IR SOLN
Status: DC | PRN
  Administered 2023-04-05: 1

## 2023-04-05 MED ORDER — SCOPOLAMINE 1 MG/3DAYS TD PT72: 1.0000 | MEDICATED_PATCH | Freq: Once | TRANSDERMAL | Status: DC

## 2023-04-05 MED ORDER — SIMETHICONE 80 MG PO CHEW: 80.0000 mg | CHEWABLE_TABLET | ORAL | Status: DC | PRN

## 2023-04-05 MED ORDER — OXYTOCIN-SODIUM CHLORIDE 30-0.9 UT/500ML-% IV SOLN: 2.5000 [IU]/h | INTRAVENOUS | Status: AC

## 2023-04-05 MED ORDER — OXYCODONE HCL 5 MG PO TABS: 5.0000 mg | ORAL_TABLET | Freq: Once | ORAL | Status: DC | PRN

## 2023-04-05 MED ORDER — PRENATAL MULTIVITAMIN CH
1.0000 | ORAL_TABLET | Freq: Every day | ORAL | Status: DC
  Administered 2023-04-06 – 2023-04-08 (×3): 1 via ORAL
  Filled 2023-04-05 (×3): qty 1

## 2023-04-05 MED ORDER — OXYCODONE HCL 5 MG PO TABS: 5.0000 mg | ORAL_TABLET | ORAL | Status: DC | PRN

## 2023-04-05 MED ORDER — SODIUM CHLORIDE 0.9% FLUSH: 3.0000 mL | INTRAVENOUS | Status: DC | PRN

## 2023-04-05 MED ORDER — OXYCODONE HCL 5 MG/5ML PO SOLN: 5.0000 mg | Freq: Once | ORAL | Status: DC | PRN

## 2023-04-05 MED ORDER — ZOLPIDEM TARTRATE 5 MG PO TABS: 5.0000 mg | ORAL_TABLET | Freq: Every evening | ORAL | Status: DC | PRN

## 2023-04-05 MED ORDER — DIPHENHYDRAMINE HCL 25 MG PO CAPS: 25.0000 mg | ORAL_CAPSULE | Freq: Four times a day (QID) | ORAL | Status: DC | PRN

## 2023-04-05 MED ORDER — SOD CITRATE-CITRIC ACID 500-334 MG/5ML PO SOLN
30.0000 mL | Freq: Once | ORAL | Status: AC
  Administered 2023-04-05: 30 mL via ORAL

## 2023-04-05 MED ORDER — ONDANSETRON HCL 4 MG/2ML IJ SOLN: 4.0000 mg | Freq: Three times a day (TID) | INTRAMUSCULAR | Status: DC | PRN

## 2023-04-05 MED ORDER — FENTANYL CITRATE (PF) 100 MCG/2ML IJ SOLN
INTRAMUSCULAR | Status: AC
  Filled 2023-04-05: qty 2

## 2023-04-05 MED ORDER — SIMETHICONE 80 MG PO CHEW
80.0000 mg | CHEWABLE_TABLET | Freq: Three times a day (TID) | ORAL | Status: DC
  Administered 2023-04-06 – 2023-04-08 (×6): 80 mg via ORAL
  Filled 2023-04-05 (×6): qty 1

## 2023-04-05 MED ORDER — ACETAMINOPHEN 500 MG PO TABS
1000.0000 mg | ORAL_TABLET | Freq: Four times a day (QID) | ORAL | Status: DC
  Administered 2023-04-05 – 2023-04-08 (×11): 1000 mg via ORAL
  Filled 2023-04-05 (×12): qty 2

## 2023-04-05 MED ORDER — NALOXONE HCL 0.4 MG/ML IJ SOLN: 0.4000 mg | INTRAMUSCULAR | Status: DC | PRN

## 2023-04-05 MED ORDER — WITCH HAZEL-GLYCERIN EX PADS: 1.0000 | MEDICATED_PAD | CUTANEOUS | Status: DC | PRN

## 2023-04-05 MED ORDER — CEFAZOLIN SODIUM-DEXTROSE 2-4 GM/100ML-% IV SOLN
INTRAVENOUS | Status: AC
  Filled 2023-04-05: qty 100

## 2023-04-05 MED ORDER — DIPHENHYDRAMINE HCL 25 MG PO CAPS: 25.0000 mg | ORAL_CAPSULE | ORAL | Status: DC | PRN

## 2023-04-05 MED ORDER — POVIDONE-IODINE 10 % EX SWAB: 2.0000 | Freq: Once | CUTANEOUS | Status: DC

## 2023-04-05 MED ORDER — ORAL CARE MOUTH RINSE: 15.0000 mL | Freq: Once | OROMUCOSAL | Status: AC

## 2023-04-05 MED ORDER — TRANEXAMIC ACID-NACL 1000-0.7 MG/100ML-% IV SOLN
INTRAVENOUS | Status: AC
  Filled 2023-04-05: qty 100

## 2023-04-05 MED ORDER — FAMOTIDINE 20 MG PO TABS
ORAL_TABLET | ORAL | Status: AC
  Filled 2023-04-05: qty 1

## 2023-04-05 MED ORDER — NALOXONE HCL 4 MG/10ML IJ SOLN: 1.0000 ug/kg/h | INTRAVENOUS | Status: DC | PRN

## 2023-04-05 MED ORDER — FAMOTIDINE 20 MG PO TABS
20.0000 mg | ORAL_TABLET | Freq: Once | ORAL | Status: AC
  Administered 2023-04-05: 20 mg via ORAL

## 2023-04-05 MED ORDER — IBUPROFEN 600 MG PO TABS
600.0000 mg | ORAL_TABLET | Freq: Four times a day (QID) | ORAL | Status: AC
  Administered 2023-04-05 – 2023-04-08 (×11): 600 mg via ORAL
  Filled 2023-04-05 (×12): qty 1

## 2023-04-05 MED ORDER — TRANEXAMIC ACID-NACL 1000-0.7 MG/100ML-% IV SOLN
1000.0000 mg | Freq: Once | INTRAVENOUS | Status: AC
  Administered 2023-04-05: 1000 mg via INTRAVENOUS

## 2023-04-05 MED ORDER — STERILE WATER FOR IRRIGATION IR SOLN
Status: DC | PRN
  Administered 2023-04-05: 1000 mL

## 2023-04-05 MED ORDER — PROMETHAZINE HCL 25 MG/ML IJ SOLN: 6.2500 mg | INTRAMUSCULAR | Status: DC | PRN

## 2023-04-05 MED ORDER — BUPIVACAINE IN DEXTROSE 0.75-8.25 % IT SOLN
INTRATHECAL | Status: DC | PRN
  Administered 2023-04-05: 1.4 mL via INTRATHECAL

## 2023-04-05 MED ORDER — MEASLES, MUMPS & RUBELLA VAC IJ SOLR: 0.5000 mL | Freq: Once | INTRAMUSCULAR | Status: DC

## 2023-04-05 MED ORDER — MENTHOL 3 MG MT LOZG: 1.0000 | LOZENGE | OROMUCOSAL | Status: DC | PRN

## 2023-04-05 MED ORDER — FENTANYL CITRATE (PF) 100 MCG/2ML IJ SOLN
INTRAMUSCULAR | Status: DC | PRN
  Administered 2023-04-05: 15 ug via INTRATHECAL

## 2023-04-05 MED ORDER — CHLORHEXIDINE GLUCONATE 0.12 % MT SOLN
OROMUCOSAL | Status: AC
  Filled 2023-04-05: qty 15

## 2023-04-05 MED ORDER — MEPERIDINE HCL 25 MG/ML IJ SOLN: 6.2500 mg | INTRAMUSCULAR | Status: DC | PRN

## 2023-04-05 MED ORDER — LEVONORGESTREL 20 MCG/DAY IU IUD
INTRAUTERINE_SYSTEM | INTRAUTERINE | Status: AC
  Filled 2023-04-05: qty 1

## 2023-04-05 MED ORDER — ACETAMINOPHEN 325 MG PO TABS
ORAL_TABLET | ORAL | Status: AC
  Filled 2023-04-05: qty 2

## 2023-04-05 MED ORDER — ACETAMINOPHEN 500 MG PO TABS
1000.0000 mg | ORAL_TABLET | Freq: Once | ORAL | Status: AC
  Administered 2023-04-05: 1000 mg via ORAL

## 2023-04-05 MED ORDER — ENOXAPARIN SODIUM 40 MG/0.4ML IJ SOSY
40.0000 mg | PREFILLED_SYRINGE | INTRAMUSCULAR | Status: DC
  Administered 2023-04-06 – 2023-04-08 (×3): 40 mg via SUBCUTANEOUS
  Filled 2023-04-05 (×3): qty 0.4

## 2023-04-05 MED ORDER — DIPHENHYDRAMINE HCL 50 MG/ML IJ SOLN: 12.5000 mg | INTRAMUSCULAR | Status: DC | PRN

## 2023-04-05 MED ORDER — KETOROLAC TROMETHAMINE 30 MG/ML IJ SOLN
30.0000 mg | Freq: Four times a day (QID) | INTRAMUSCULAR | Status: AC | PRN
  Administered 2023-04-05: 30 mg via INTRAVENOUS

## 2023-04-05 MED ORDER — PHENYLEPHRINE HCL-NACL 20-0.9 MG/250ML-% IV SOLN
INTRAVENOUS | Status: DC | PRN
  Administered 2023-04-05: 60 ug/min via INTRAVENOUS

## 2023-04-05 MED ORDER — KETOROLAC TROMETHAMINE 30 MG/ML IJ SOLN: 30.0000 mg | Freq: Four times a day (QID) | INTRAMUSCULAR | Status: AC | PRN

## 2023-04-05 MED ORDER — ONDANSETRON HCL 4 MG/2ML IJ SOLN
INTRAMUSCULAR | Status: AC
  Filled 2023-04-05: qty 2

## 2023-04-05 MED ORDER — PHENYLEPHRINE HCL-NACL 20-0.9 MG/250ML-% IV SOLN
INTRAVENOUS | Status: AC
  Filled 2023-04-05: qty 250

## 2023-04-05 MED ORDER — SCOPOLAMINE 1 MG/3DAYS TD PT72
1.0000 | MEDICATED_PATCH | Freq: Once | TRANSDERMAL | Status: DC
  Administered 2023-04-05: 1.5 mg via TRANSDERMAL

## 2023-04-05 MED ORDER — ONDANSETRON HCL 4 MG/2ML IJ SOLN
INTRAMUSCULAR | Status: DC | PRN
Start: 2023-04-05 — End: 2023-04-05
  Administered 2023-04-05: 4 mg via INTRAVENOUS

## 2023-04-05 MED ORDER — CHLORHEXIDINE GLUCONATE 0.12 % MT SOLN
15.0000 mL | Freq: Once | OROMUCOSAL | Status: AC
  Administered 2023-04-05: 15 mL via OROMUCOSAL

## 2023-04-05 MED ORDER — CEFAZOLIN SODIUM-DEXTROSE 2-4 GM/100ML-% IV SOLN
2.0000 g | INTRAVENOUS | Status: AC
  Administered 2023-04-05: 2 g via INTRAVENOUS

## 2023-04-05 MED ORDER — SENNOSIDES-DOCUSATE SODIUM 8.6-50 MG PO TABS
2.0000 | ORAL_TABLET | ORAL | Status: DC
  Administered 2023-04-06 – 2023-04-07 (×2): 2 via ORAL
  Filled 2023-04-05 (×2): qty 2

## 2023-04-05 MED ORDER — SOD CITRATE-CITRIC ACID 500-334 MG/5ML PO SOLN
ORAL | Status: AC
  Filled 2023-04-05: qty 30

## 2023-04-05 MED ORDER — OXYTOCIN-SODIUM CHLORIDE 30-0.9 UT/500ML-% IV SOLN
INTRAVENOUS | Status: DC | PRN
  Administered 2023-04-05: 300 mL via INTRAVENOUS

## 2023-04-05 MED ORDER — COCONUT OIL OIL: 1.0000 | TOPICAL_OIL | Status: DC | PRN

## 2023-04-05 MED ORDER — FENTANYL CITRATE (PF) 100 MCG/2ML IJ SOLN: 25.0000 ug | INTRAMUSCULAR | Status: DC | PRN

## 2023-04-05 SURGICAL SUPPLY — 30 items
APL PRP STRL LF DISP 70% ISPRP (MISCELLANEOUS) ×2
APL SKNCLS STERI-STRIP NONHPOA (GAUZE/BANDAGES/DRESSINGS) ×1
BENZOIN TINCTURE PRP APPL 2/3 (GAUZE/BANDAGES/DRESSINGS) ×1 IMPLANT
CHLORAPREP W/TINT 26 (MISCELLANEOUS) ×2 IMPLANT
CLAMP UMBILICAL CORD (MISCELLANEOUS) ×1 IMPLANT
CLOTH BEACON ORANGE TIMEOUT ST (SAFETY) ×1 IMPLANT
DRSG OPSITE POSTOP 4X10 (GAUZE/BANDAGES/DRESSINGS) ×1 IMPLANT
ELECT REM PT RETURN 9FT ADLT (ELECTROSURGICAL) ×1
ELECTRODE REM PT RTRN 9FT ADLT (ELECTROSURGICAL) ×1 IMPLANT
EXTRACTOR VACUUM M CUP 4 TUBE (SUCTIONS) IMPLANT
GAUZE SPONGE 4X4 12PLY STRL LF (GAUZE/BANDAGES/DRESSINGS) IMPLANT
GLOVE BIOGEL PI IND STRL 7.0 (GLOVE) ×2 IMPLANT
GLOVE BIOGEL PI IND STRL 7.5 (GLOVE) ×2 IMPLANT
GLOVE ECLIPSE 7.5 STRL STRAW (GLOVE) ×1 IMPLANT
GOWN STRL REUS W/TWL LRG LVL3 (GOWN DISPOSABLE) ×3 IMPLANT
KIT ABG SYR 3ML LUER SLIP (SYRINGE) IMPLANT
NDL HYPO 25X5/8 SAFETYGLIDE (NEEDLE) IMPLANT
NEEDLE HYPO 25X5/8 SAFETYGLIDE (NEEDLE) IMPLANT
NS IRRIG 1000ML POUR BTL (IV SOLUTION) ×1 IMPLANT
PACK C SECTION WH (CUSTOM PROCEDURE TRAY) ×1 IMPLANT
PAD OB MATERNITY 4.3X12.25 (PERSONAL CARE ITEMS) ×1 IMPLANT
RTRCTR C-SECT PINK 25CM LRG (MISCELLANEOUS) ×1 IMPLANT
STRIP CLOSURE SKIN 1/2X4 (GAUZE/BANDAGES/DRESSINGS) ×1 IMPLANT
SUT VIC AB 0 CT1 36 (SUTURE) ×3 IMPLANT
SUT VIC AB 2-0 CT1 27 (SUTURE) ×1
SUT VIC AB 2-0 CT1 TAPERPNT 27 (SUTURE) ×1 IMPLANT
SUT VIC AB 4-0 KS 27 (SUTURE) ×1 IMPLANT
TOWEL OR 17X24 6PK STRL BLUE (TOWEL DISPOSABLE) ×1 IMPLANT
TRAY FOLEY W/BAG SLVR 14FR LF (SET/KITS/TRAYS/PACK) ×1 IMPLANT
WATER STERILE IRR 1000ML POUR (IV SOLUTION) ×1 IMPLANT

## 2023-04-05 NOTE — Anesthesia Postprocedure Evaluation (Signed)
Anesthesia Post Note  Patient: Anne Long  Procedure(s) Performed: CESAREAN SECTION     Patient location during evaluation: PACU Anesthesia Type: Spinal Level of consciousness: awake and alert Pain management: pain level controlled Vital Signs Assessment: post-procedure vital signs reviewed and stable Respiratory status: spontaneous breathing and respiratory function stable Cardiovascular status: blood pressure returned to baseline and stable Postop Assessment: spinal receding and no apparent nausea or vomiting Anesthetic complications: no   No notable events documented.  Last Vitals:  Vitals:   04/05/23 1515 04/05/23 1532  BP: 104/68 105/69  Pulse: 60 (!) 59  Resp: 16 16  Temp: 36.4 C 36.7 C  SpO2: 100% 99%    Last Pain:  Vitals:   04/05/23 1532  TempSrc: Oral  PainSc:    Pain Goal: Patients Stated Pain Goal: 6 (04/05/23 1415)                 Beryle Lathe

## 2023-04-05 NOTE — Transfer of Care (Signed)
Immediate Anesthesia Transfer of Care Note  Patient: Anne Long  Procedure(s) Performed: CESAREAN SECTION  Patient Location: PACU  Anesthesia Type:Spinal  Level of Consciousness: awake  Airway & Oxygen Therapy: Patient Spontanous Breathing  Post-op Assessment: Report given to RN and Post -op Vital signs reviewed and stable  Post vital signs: Reviewed and stable  Last Vitals:  Vitals Value Taken Time  BP 104/63 04/05/23 1354  Temp 36.6 C 04/05/23 1354  Pulse 65 04/05/23 1356  Resp 16 04/05/23 1356  SpO2 100 % 04/05/23 1356  Vitals shown include unfiled device data.  Last Pain:  Vitals:   04/05/23 1354  TempSrc: Oral         Complications: No notable events documented.

## 2023-04-05 NOTE — Anesthesia Procedure Notes (Signed)
Spinal  Patient location during procedure: OR Start time: 04/05/2023 12:30 PM End time: 04/05/2023 12:33 PM Reason for block: surgical anesthesia Staffing Performed: anesthesiologist  Anesthesiologist: Beryle Lathe, MD Performed by: Beryle Lathe, MD Authorized by: Beryle Lathe, MD   Preanesthetic Checklist Completed: patient identified, IV checked, risks and benefits discussed, surgical consent, monitors and equipment checked, pre-op evaluation and timeout performed Spinal Block Patient position: sitting Prep: DuraPrep Patient monitoring: heart rate, cardiac monitor, continuous pulse ox and blood pressure Approach: midline Location: L2-3 Injection technique: single-shot Needle Needle type: Pencan  Needle gauge: 24 G Additional Notes Consent was obtained prior to the procedure with all questions answered and concerns addressed. Risks including, but not limited to, bleeding, infection, nerve damage, paralysis, failed block, inadequate analgesia, allergic reaction, high spinal, itching, and headache were discussed and the patient wished to proceed. Functioning IV was confirmed and monitors were applied. Sterile prep and drape, including hand hygiene, mask, and sterile gloves were used. The patient was positioned and the spine was prepped. The skin was anesthetized with lidocaine. Free flow of clear CSF was obtained prior to injecting local anesthetic into the CSF. The spinal needle aspirated freely following injection. The needle was carefully withdrawn. The patient tolerated the procedure well.   Leslye Peer, MD

## 2023-04-05 NOTE — Anesthesia Preprocedure Evaluation (Addendum)
Anesthesia Evaluation  Patient identified by MRN, date of birth, ID band Patient awake    Reviewed: Allergy & Precautions, NPO status , Patient's Chart, lab work & pertinent test results  History of Anesthesia Complications Negative for: history of anesthetic complications  Airway Mallampati: II  TM Distance: >3 FB Neck ROM: Full    Dental  (+) Dental Advisory Given, Teeth Intact   Pulmonary neg pulmonary ROS   Pulmonary exam normal        Cardiovascular negative cardio ROS Normal cardiovascular exam     Neuro/Psych negative neurological ROS  negative psych ROS   GI/Hepatic Neg liver ROS,GERD  Medicated and Controlled,,  Endo/Other   Obesity   Renal/GU negative Renal ROS     Musculoskeletal negative musculoskeletal ROS (+)    Abdominal   Peds  Hematology  (+) Blood dyscrasia, anemia  Alpha thalassemia carrier - no hx of bleeding issues Plt 232k    Anesthesia Other Findings   Reproductive/Obstetrics (+) Pregnancy                             Anesthesia Physical Anesthesia Plan  ASA: 2  Anesthesia Plan: Spinal   Post-op Pain Management:    Induction:   PONV Risk Score and Plan: 2 and Treatment may vary due to age or medical condition, Ondansetron and Scopolamine patch - Pre-op  Airway Management Planned: Natural Airway  Additional Equipment: None  Intra-op Plan:   Post-operative Plan:   Informed Consent: I have reviewed the patients History and Physical, chart, labs and discussed the procedure including the risks, benefits and alternatives for the proposed anesthesia with the patient or authorized representative who has indicated his/her understanding and acceptance.       Plan Discussed with: CRNA and Anesthesiologist  Anesthesia Plan Comments: (Labs reviewed, platelets acceptable. Discussed risks and benefits of spinal, including spinal/epidural hematoma,  infection, failed block, and PDPH. Patient expressed understanding and wished to proceed. )       Anesthesia Quick Evaluation

## 2023-04-05 NOTE — Discharge Summary (Signed)
Postpartum Discharge Summary     Patient Name: Anne Long DOB: [birth year] MRN: [id number]  Date of admission: 04/05/2023 Delivery date:04/05/2023 Delivering provider: Shonna Chock [place] Date of discharge: 04/08/2023  Admitting diagnosis: History of shoulder dystocia in prior pregnancy, currently pregnant in third trimester [O09.293] Intrauterine pregnancy: [redacted]w[redacted]d     Secondary diagnosis:  Principal Problem:   History of shoulder dystocia in prior pregnancy, currently pregnant in third trimester Active Problems:   Anemia affecting pregnancy   Advanced maternal age in multigravida, third trimester   Obesity affecting pregnancy   History of shoulder dystocia in prior pregnancy, currently pregnant  Additional problems: none    Discharge diagnosis: Term Pregnancy Delivered                                              Post partum procedures: none Augmentation: N/A Complications: None  Hospital course: Sceduled C/S   37 y.o. yo [id number] at [redacted]w[redacted]d was admitted to the hospital 04/05/2023 for scheduled cesarean section with the following indication:Macrosomia and hx of shoulder dystocia .Delivery details are as follows:  Membrane Rupture Time/Date: 12:57 PM,04/05/2023  Delivery Method:C-Section, Low Transverse Details of operation can be found in separate operative note.  Patient had a postpartum course complicated by nonthing.  She is ambulating, tolerating a regular diet, passing flatus, and urinating well. Patient is discharged home in stable condition on  04/08/23        Newborn Data: Birth date:04/05/2023 Birth time:12:57 PM Gender:Female Living status:Living Apgars:9 ,9  Weight:4150 g    Magnesium Sulfate received: No BMZ received: No Rhophylac:No MMR:N/A T-DaP:Given prenatally Flu: N/A Transfusion:No  Physical exam  Vitals:   04/06/23 2217 04/07/23 0522 04/07/23 2059 04/08/23 0534  BP: 115/74 107/68 122/75 114/79  Pulse: 80 61 69 66  Resp: 17 18 17 16   Temp: 98.1 F  (36.7 C) 98.1 F (36.7 C) 98.2 F (36.8 C) 98.4 F (36.9 C)  TempSrc: Oral Oral Oral Oral  SpO2: 99%  98%   Weight:      Height:       General: alert, cooperative, and no distress Lochia: appropriate Uterine Fundus: firm DVT Evaluation: No evidence of DVT seen on physical exam. Labs: Lab Results  Component Value Date   WBC 10.3 04/06/2023   HGB 8.0 (L) 04/06/2023   HCT 24.7 (L) 04/06/2023   MCV 75.1 (L) 04/06/2023   PLT 225 04/06/2023      Latest Ref Rng & Units 09/28/2022    3:57 PM  CMP  Glucose 70 - 99 mg/dL 86   BUN 6 - 20 mg/dL 6   Creatinine 1.02 - 7.25 mg/dL 3.66   Sodium 440 - 347 mmol/L 133   Potassium 3.5 - 5.2 mmol/L 3.9   Chloride 96 - 106 mmol/L 97   CO2 20 - 29 mmol/L 22   Calcium 8.7 - 10.2 mg/dL 9.2   Total Protein 6.0 - 8.5 g/dL 7.1   Total Bilirubin 0.0 - 1.2 mg/dL <4.2   Alkaline Phos 44 - 121 IU/L 60   AST 0 - 40 IU/L 15   ALT 0 - 32 IU/L 9    Edinburgh Score:    04/06/2023    3:43 PM  Edinburgh Postnatal Depression Scale Screening Tool  I have been able to laugh and see the funny side of things. 0  I have  looked forward with enjoyment to things. 0  I have blamed myself unnecessarily when things went wrong. 1  I have been anxious or worried for no good reason. 0  I have felt scared or panicky for no good reason. 1  Things have been getting on top of me. 0  I have been so unhappy that I have had difficulty sleeping. 0  I have felt sad or miserable. 0  I have been so unhappy that I have been crying. 0  The thought of harming myself has occurred to me. 0  Edinburgh Postnatal Depression Scale Total 2     After visit meds:  Allergies as of 04/08/2023       Reactions   Other Swelling   Shrimp        Medication List     TAKE these medications    aspirin 81 MG chewable tablet Chew 1 tablet (81 mg total) by mouth daily.   ferrous sulfate 325 (65 FE) MG EC tablet Take 1 tablet (325 mg total) by mouth every other day. What changed:  when to take this   ibuprofen 600 MG tablet Commonly known as: ADVIL Take 1 tablet (600 mg total) by mouth every 6 (six) hours.   omeprazole 20 MG tablet Commonly known as: PriLOSEC OTC Take 1 tablet (20 mg total) by mouth daily.   oxyCODONE 5 MG immediate release tablet Commonly known as: Oxy IR/ROXICODONE Take 1 tablet (5 mg total) by mouth every 4 (four) hours as needed for moderate pain.   PRENATAL VITAMINS PO Take 1 tablet by mouth daily.         Discharge home in stable condition Infant Feeding: Breast Infant Disposition:home with mother Discharge instruction: per After Visit Summary and Postpartum booklet. Activity: Advance as tolerated. Pelvic rest for 6 weeks.  Diet: routine diet Future Appointments: Future Appointments  Date Time Provider Department Center  04/12/2023  1:50 PM [facility] NURSE [facility]  05/18/2023  3:10 PM Anyanwu, Jethro Bastos, MD [facility]   Follow up Visit:  Follow-up Information     [facility] at [facility] Follow up.   Specialty: Obstetrics and Gynecology Contact information: [address] [place] [address] [phone]                Message sent to [facility] by Autry-Lott on 04/08/2023  Please schedule this patient for a In person postpartum visit in 6 weeks with the following provider: MD and APP. Additional Postpartum F/U: none   High risk pregnancy complicated by:  AMA, macrosomia, anemia, hx of shoulder dystocia Delivery mode:  C-Section, Low Transverse Anticipated Birth Control:  IUD   04/08/2023 Reva Bores, MD

## 2023-04-05 NOTE — Lactation Note (Signed)
This note was copied from a baby's chart. Lactation Consultation Note  Patient Name: Anne Long [id number] Date: 04/05/2023 Age:36 hours Reason for consult: Initial assessment;Term Experienced BF mom stated BF going well having no difficulty so far mom stated the baby has had 4 good feedings all ready. Mom denies painful latches. Newborn feeding habits, Behavior, STS, I&O, reviewed. Mom encouraged to feed baby 8-12 times/24 hours and with feeding cues.  Asked mom to call for assistance or questions. Praised mom for doing so well.  Maternal Data Has patient been taught Hand Expression?: Yes Does the patient have breastfeeding experience prior to this delivery?: Yes How long did the patient breastfeed?: 5 months to her now 46 month old  Feeding    LATCH Score                    Lactation Tools Discussed/Used    Interventions Interventions: LC Services brochure;Breast feeding basics reviewed;Skin to skin  Discharge    Consult Status Consult Status: Follow-up Date: 04/06/23 Follow-up type: In-patient    Charyl Dancer 04/05/2023, 9:49 PM

## 2023-04-05 NOTE — Op Note (Signed)
Cesarean Section Operative Report  Anne Long  04/05/2023  Indications: suspected macrosomia, history shoulder dystocia  Pre-operative Diagnosis: primary low transverse cesarean section  Post-operative Diagnosis: Same   Surgeon: Surgeons and Role:    * Linlee Cromie, Wilfred Curtis, MD - Primary    * Autry-Lott, Randa Evens, DO   Attending Attestation: I was present and scrubbed for the entire procedure.   An experienced assistant was required given the standard of surgical care given the complexity of the case.  This assistant was needed for exposure, dissection, suctioning, retraction, instrument exchange, assisting with delivery with administration of fundal pressure, and for overall help during the procedure.  Anesthesia: spinal    Estimated Blood Loss: 850 ml  Total IV Fluids: 2500 ml LR  Urine Output:: 50 ml  clear yellow urine  Specimens: none  Findings: Viable female infant in cephalic presentation; Apgars pending; weight pending; arterial cord pH not obtained;  clear amniotic fluid; intact placenta with three vessel cord; normal uterus, fallopian tubes and ovaries bilaterally.  Baby condition / location:  Couplet care / Skin to Skin   Complications: We forgot to place the post-placental IUD!  Indications: Anne Long is a 37 y.o. [phone] with an IUP [redacted]w[redacted]d presenting for scheduled elective primary cesarean section for suspected macrosomia with a history of prior shoulder dystocia.  The risks, benefits, complications, treatment options, and exected outcomes were discussed with the patient . The patient dwith the proposed plan, giving informed consent. identified as Anne Long and the procedure verified as C-Section Delivery.  Procedure Details:  The patient was taken back to the operative suite where spinal anesthesia was placed.  A time out was held and the above information confirmed.   After induction of anesthesia, the patient was draped and prepped in the  usual sterile manner and placed in a dorsal supine position with a leftward tilt. A Pfannenstiel incision was made and carried down through the subcutaneous tissue to the fascia.  Fascial incision was made and bluntly extended transversely, shallow laceration to right rectus noted. The fascia was separated from the underlying rectus tissue superiorly and inferiorly. The peritoneum was identified and bluntly entered and extended longitudinally. Alexis retractor was placed. A bladder flap was not created. Fundal fibroid appreciated. A low transverse uterine incision was made and extended bluntly. Delivered from cephalic presentation was a viable infant with Apgars and weight as above.  After waiting 60 seconds for delayed cord cutting, the umbilical cord was clamped and cut cord blood was obtained for evaluation. Cord ph was not sent. The placenta was removed Intact and appeared normal. The uterine incision was closed with running unlocked sutures 0-Vicryl in one layer.   Hemostasis was observed. It was at this time that we realized we forgot to place the post-placental IUD. Patient awake and lucid. Explained the situation. Offered to re-open the hysterotomy and place the IUD, shared this would add some time to the case and there would be some bleeding risk. She elected against this, prefers instead to place the IUD at outpatient follow-up. I apologized profusely. The peritoneum was closed with 2-0 vicryl. The rectus muscles were examined and hemostasis observed. The fascia was then reapproximated with running sutures of 0-Vicryl. The skin was closed with 4-0 Vicryl.  Instrument, sponge, and needle counts were correct prior the abdominal closure and were correct at the conclusion of the case.     Disposition: PACU - hemodynamically stable.   Maternal Condition: stable  Signed: Silvano Bilis, MD 04/05/2023 1:39 PM

## 2023-04-06 LAB — CBC
HCT: 24.7 % — ABNORMAL LOW (ref 36.0–46.0)
Hemoglobin: 8 g/dL — ABNORMAL LOW (ref 12.0–15.0)
MCH: 24.3 pg — ABNORMAL LOW (ref 26.0–34.0)
MCV: 75.1 fL — ABNORMAL LOW (ref 80.0–100.0)
Platelets: 225 10*3/uL (ref 150–400)
RBC: 3.29 MIL/uL — ABNORMAL LOW (ref 3.87–5.11)
RDW: 16.2 % — ABNORMAL HIGH (ref 11.5–15.5)
WBC: 10.3 10*3/uL (ref 4.0–10.5)
nRBC: 0 % (ref 0.0–0.2)

## 2023-04-06 MED ORDER — GELATIN ABSORBABLE 12-7 MM EX MISC: 1.0000 | Freq: Once | CUTANEOUS | Status: DC | PRN

## 2023-04-06 MED ORDER — EPINEPHRINE TOPICAL FOR CIRCUMCISION 0.1 MG/ML: 1.0000 [drp] | TOPICAL | Status: DC | PRN

## 2023-04-06 MED ORDER — SUCROSE 24% NICU/PEDS ORAL SOLUTION: 0.5000 mL | OROMUCOSAL | Status: DC | PRN

## 2023-04-06 MED ORDER — WHITE PETROLATUM EX OINT: 1.0000 | TOPICAL_OINTMENT | CUTANEOUS | Status: DC | PRN

## 2023-04-06 MED ORDER — LIDOCAINE 1% INJECTION FOR CIRCUMCISION: 0.8000 mL | INJECTION | Freq: Once | INTRAVENOUS | Status: DC

## 2023-04-06 MED ORDER — IRON SUCROSE 500 MG IVPB - SIMPLE MED
500.0000 mg | Freq: Once | INTRAVENOUS | Status: AC
  Administered 2023-04-06: 500 mg via INTRAVENOUS
  Filled 2023-04-06: qty 275

## 2023-04-06 NOTE — Progress Notes (Signed)
Spoke w/ patient at bedside yesterday afternoon, apologized again for forgetting to place IUD (this was discovered intra-op after closure of uterus but prior to abdominal closure, discussed with patient intra-op and she elected to proceed with closure). I spoke with our practice administrator who reviewed patient's insurance and says she has contraceptive benefits and will have met her deductible for this year, so it does not appear there will be any additional charge to the patient if she elects to have an IUD placed at her postpartum visit (though there could be a charge if elects to pursue in a later year). I asked the patient to let me know if she does end up receiving any charges related to placement of the IUD this year, and if that happens I said I would do what I can to resolve the charges.

## 2023-04-06 NOTE — Progress Notes (Signed)
Subjective: Postpartum Day #1: Cesarean Delivery Patient reports tolerating PO and no problems voiding; infant doing a lot of clusterfeeding; she desires a circumcision for her son- she was consented and a note placed in his chart; plans for IUD at [facility] visit  Objective: Vital signs in last 24 hours: Temp:  [97.6 F (36.4 C)-99.3 F (37.4 C)] 98.2 F (36.8 C) (07/25 0357) Pulse Rate:  [58-95] 58 (07/25 0357) Resp:  [13-21] 17 (07/25 0357) BP: (94-113)/(54-77) 94/64 (07/25 0357) SpO2:  [95 %-100 %] 98 % (07/25 0357) Weight:  [75.5 kg] 75.5 kg (07/24 1045)  Physical Exam:  General: cooperative and no distress Lochia: appropriate Uterine Fundus: firm Incision: honeycomb intact and dry DVT Evaluation: No evidence of DVT seen on physical exam.  Recent Labs    04/03/23 1048 04/06/23 0438  HGB 9.6* 8.0*  HCT 31.1* 24.7*    Assessment/Plan: Status post Cesarean section. Doing well postoperatively.  Continue current care. IV Fe for post-op blood loss anemia. Anticipate d/c tomorrow.   Arabella Merles, CNM 04/06/2023, 7:00 AM

## 2023-04-07 ENCOUNTER — Inpatient Hospital Stay (HOSPITAL_COMMUNITY): Payer: BC Managed Care – PPO

## 2023-04-07 ENCOUNTER — Encounter (HOSPITAL_COMMUNITY): Payer: Self-pay | Admitting: Obstetrics and Gynecology

## 2023-04-07 NOTE — Lactation Note (Signed)
This note was copied from a baby's chart. Lactation Consultation Note  Patient Name: Anne Long [id number] Date: 04/07/2023 Age:36 hours Reason for consult: Follow-up assessment;Term;Breastfeeding assistance;Infant weight loss (7.59% WL)  The infant was at 27 hours old.  Per the birth parent the infant is doing okay with latching but she is feeling some pain when feeding the infant.  She commented that sometimes the infant chewed on her nipple rather than sucking.  The birth parent stated that she had recently fed the infant but she would try to latch him again.  The birth parent latched the infant in the football position on the right breast.  The infant latched himself to the tip of the nipple.  LC took the infant off the breast and assisted the birth parent with using a sandwich hold to put the infant on the breast deeply.  The birth parent latched the infant deeply, his lips were flanged, and the infant took a few sucks before falling asleep.  The birth parent stated that the latch felt much better.  LC also encouraged the birth parent to let the infant suck on a clean finger to practice sucking rather than chewing on the nipple, flanging his upper lip, and massaging his labial frenulum.  The birth parent was given coconut oil for the nipple soreness.  All questions were answered.   Feeding Mother's Current Feeding Choice: Breast Milk  LATCH Score Latch: Grasps breast easily, tongue down, lips flanged, rhythmical sucking.  Audible Swallowing: A few with stimulation  Type of Nipple: Everted at rest and after stimulation  Comfort (Breast/Nipple): Filling, red/small blisters or bruises, mild/mod discomfort  Hold (Positioning): Assistance needed to correctly position infant at breast and maintain latch.  LATCH Score: 7  Interventions Interventions: Assisted with latch;Adjust position;Support pillows;Education  Consult Status Consult Status: Follow-up Date:  04/08/23 Follow-up type: In-patient  Delene Loll 04/07/2023, 4:19 PM

## 2023-04-07 NOTE — Lactation Note (Signed)
This note was copied from a baby's chart. Lactation Consultation Note  Patient Name: Anne Long [id number] Date: 04/07/2023 Age:36 hours  Attempted to see mom but everyone in rm. Was sleeping.   Maternal Data    Feeding    LATCH Score                    Lactation Tools Discussed/Used    Interventions    Discharge    Consult Status      Anne Long 04/07/2023, 5:06 AM

## 2023-04-07 NOTE — Progress Notes (Signed)
Post-Op Day 2, primary CS for suspected LGA w/Hx SD  Subjective: No complaints, up ad lib, voiding and tolerating PO, passing flatus,small lochia,.plans to breastfeed, plans to bottle feed, plans outpt IUD  Objective: Blood pressure 107/68, pulse 61, temperature 98.1 F (36.7 C), temperature source Oral, resp. rate 18, height 4\' 11"  (1.499 m), weight 75.5 kg, SpO2 99%, unknown if currently breastfeeding.  Physical Exam:  General: alert, cooperative and no distress Lochia:normal flow Chest: CTAB Heart: RRR no m/r/g Abdomen: +BS, soft, nontender, dsg c/d/intact, no erythema Uterine Fundus: firm DVT Evaluation: No evidence of DVT seen on physical exam. Extremities: trace edema  Recent Labs    04/06/23 0438  HGB 8.0*  HCT 24.7*    Assessment/Plan: Plan for discharge tomorrow and Breastfeeding   LOS: 2 days   Anne Long 04/07/2023, 7:51 AM

## 2023-04-08 MED ORDER — IBUPROFEN 600 MG PO TABS: 600.0000 mg | ORAL_TABLET | Freq: Four times a day (QID) | ORAL | 0 refills | Status: DC

## 2023-04-08 MED ORDER — OXYCODONE HCL 5 MG PO TABS: 5.0000 mg | ORAL_TABLET | ORAL | 0 refills | Status: DC | PRN

## 2023-04-08 NOTE — Lactation Note (Signed)
This note was copied from a baby's chart. Lactation Consultation Note  Patient Name: Anne Long [id number] Date: 04/08/2023 Age:36 hours Reason for consult: Follow-up assessment;Term  P2, term, 10% weight loss  Mother's milk is coming to volume. Audible swallows noted when infant is feeding at breast. Taught alternate breast compression to increase milk transfer. Baby has exclusively breast fed. Mother will pump and feed expressed milk to baby to increase intake, as needed.   Encouraged to latch baby with feeding cues, place baby skin to skin if not latching,  feed 8-12 plus times in 24 hours  and reviewed supply and demand.  Mom made aware of O/P services, breastfeeding support groups, community resources, and our phone # for post-discharge questions.      Feeding Mother's Current Feeding Choice: Breast Milk  LATCH Score Latch: Grasps breast easily, tongue down, lips flanged, rhythmical sucking.  Audible Swallowing: Spontaneous and intermittent  Type of Nipple: Everted at rest and after stimulation  Comfort (Breast/Nipple): Filling, red/small blisters or bruises, mild/mod discomfort  Hold (Positioning): No assistance needed to correctly position infant at breast.  LATCH Score: 9  Interventions Interventions: Breast feeding basics reviewed;Breast compression;Education  Discharge Discharge Education: Engorgement and breast care;Warning signs for feeding baby Pump: DEBP;Personal  Consult Status Consult Status: Complete Date: 04/08/23    [name] 04/08/2023, 1:22 PM

## 2023-04-12 ENCOUNTER — Ambulatory Visit (INDEPENDENT_AMBULATORY_CARE_PROVIDER_SITE_OTHER): Payer: BC Managed Care – PPO

## 2023-04-12 VITALS — BP 119/79 | HR 77 | Wt 154.0 lb

## 2023-04-12 DIAGNOSIS — Z98891 History of uterine scar from previous surgery: Secondary | ICD-10-CM

## 2023-04-12 NOTE — Progress Notes (Signed)
Subjective:     Anne Long is a 37 y.o. female who presents to the clinic 1 weeks status post  primary low transerse  cesarean section. Pt reports incision is healing well.      Objective:    LMP  (LMP Unknown)  General:  alert, well appearing, in no apparent distress  Incision:   healing well, no drainage, no erythema, no hernia, no seroma, no swelling, no dehiscence, incision well approximated     Assessment:    Doing well postoperatively.   Plan:    1. Continue any current medications. 2. Wound care discussed. 3. Follow up:  PP.   Kennon Portela, CMA

## 2023-05-15 ENCOUNTER — Other Ambulatory Visit: Payer: Self-pay | Admitting: Family Medicine

## 2023-05-15 DIAGNOSIS — K219 Gastro-esophageal reflux disease without esophagitis: Secondary | ICD-10-CM

## 2023-05-18 ENCOUNTER — Ambulatory Visit (INDEPENDENT_AMBULATORY_CARE_PROVIDER_SITE_OTHER): Payer: BC Managed Care – PPO | Admitting: Obstetrics & Gynecology

## 2023-05-18 ENCOUNTER — Encounter: Payer: Self-pay | Admitting: Obstetrics & Gynecology

## 2023-05-18 DIAGNOSIS — Z1339 Encounter for screening examination for other mental health and behavioral disorders: Secondary | ICD-10-CM

## 2023-05-18 DIAGNOSIS — Z30430 Encounter for insertion of intrauterine contraceptive device: Secondary | ICD-10-CM

## 2023-05-18 MED ORDER — LEVONORGESTREL 20 MCG/DAY IU IUD
1.0000 | INTRAUTERINE_SYSTEM | Freq: Once | INTRAUTERINE | Status: AC
Start: 2023-05-18 — End: 2023-05-18
  Administered 2023-05-18: 1 via INTRAUTERINE

## 2023-05-18 NOTE — Progress Notes (Signed)
Post Partum Visit Note  Anne Long is a 37 y.o. [id number] female who presents for a postpartum visit. She is 6 weeks postpartum following a primary cesarean section due for fetal macrosomia, history of shoulder dystocia.  I have fully reviewed the prenatal and intrapartum course. The delivery was at 39.0 gestational weeks.  Anesthesia: spinal. Postpartum course has been uncomplicated. Baby is doing well. Baby is feeding by breast. Bleeding staining only. Bowel function is normal. Bladder function is normal. Patient is not sexually active. Desired contraception method is Mirena IUD. Postpartum depression screening: negative.  The pregnancy intention screening data noted above was reviewed. Potential methods of contraception were discussed. The patient elected to proceed with Mirena IUD.   Edinburgh Postnatal Depression Scale - 05/18/23 1521       Edinburgh Postnatal Depression Scale:  In the Past 7 Days   I have been able to laugh and see the funny side of things. 0    I have looked forward with enjoyment to things. 0    I have blamed myself unnecessarily when things went wrong. 0    I have been anxious or worried for no good reason. 0    I have felt scared or panicky for no good reason. 0    Things have been getting on top of me. 0    I have been so unhappy that I have had difficulty sleeping. 0    I have felt sad or miserable. 0    I have been so unhappy that I have been crying. 0    The thought of harming myself has occurred to me. 0    Edinburgh Postnatal Depression Scale Total 0             Health Maintenance Due  Topic Date Due   INFLUENZA VACCINE  04/13/2023   COVID-19 Vaccine (3 - 2023-24 season) 05/14/2023    The following portions of the patient's history were reviewed and updated as appropriate: allergies, current medications, past family history, past medical history, past social history, past surgical history, and problem list.  Review of Systems Pertinent  items noted in HPI and remainder of comprehensive ROS otherwise negative.  Objective: (RN present as chaperone)  BP 133/85   Pulse 80   Wt 149 lb (67.6 kg)   LMP  (LMP Unknown)   Breastfeeding Yes   BMI 30.09 kg/m    General:  alert and no distress   Breasts:  normal  Lungs: clear to auscultation bilaterally  Heart:  regular rate and rhythm  Abdomen: soft, non-tender; bowel sounds normal; no masses,  no organomegaly. Incision is C/D/I, no concerns.  GU exam:  normal       IUD Insertion Procedure Note Patient identified, informed consent performed, consent signed.   Discussed risks of irregular bleeding, cramping, infection, malpositioning or misplacement of the IUD outside the uterus which may require further procedure such as laparoscopy. Also discussed >99% contraception efficacy, increased risk of ectopic pregnancy with failure of method.   Emphasized that this did not protect against STIs, condoms recommended during all sexual encounters. Time out was performed.  Chaperone present.  Urine pregnancy test negative.  Speculum placed in the vagina.  Cervix visualized.  Cleaned with Betadine x 2.  Grasped anteriorly with a single tooth tenaculum.  Uterus sounded to 9 cm.  Mirena IUD placed per manufacturer's recommendations.  Strings trimmed to 3 cm. Tenaculum was removed, good hemostasis noted.  Patient tolerated procedure well.   Patient  was given post-procedure instructions.  She was advised to have backup contraception for one week.  Patient was also asked to check IUD strings periodically and follow up in 4 weeks for IUD check.  Assessment:   Normal postpartum exam.  Mirena IUD insertion.  Plan:   Essential components of care per ACOG recommendations:  1.  Mood and well being: Patient with negative depression screening today. Reviewed local resources for support.  - Patient tobacco use? No.   - hx of drug use? No.    2. Infant care and feeding:  -Patient currently  breastmilk feeding? Yes. Reviewed importance of draining breast regularly to support lactation.  -Social determinants of health (SDOH) reviewed in EPIC. No concerns.  3. Sexuality, contraception and birth spacing - Patient does not want a pregnancy in the next year.  Desired family size is 2 children.  - Reviewed reproductive life planning. Reviewed contraceptive methods based on pt preferences and effectiveness.  Mirena IUD placed today.   - Discussed birth spacing of 18 months  4. Sleep and fatigue -Encouraged family/partner/community support of 4 hrs of uninterrupted sleep to help with mood and fatigue  5. Physical Recovery  - Discussed patients delivery. - Patient had a C-section for suspected macrosomia, history of shoulder dystocia. No complications. - Patient has urinary incontinence? No. - Patient is safe to resume physical and sexual activity  6.  Health Maintenance - HM due items addressed Yes - Last pap smear  Diagnosis  Date Value Ref Range Status  12/28/2021   Final   - Negative for intraepithelial lesion or malignancy (NILM)   Pap smear not done at today's visit.  -Breast Cancer screening indicated? No.    Jaynie Collins, MD Center for [facility], [facility] / [facility]

## 2023-05-19 ENCOUNTER — Encounter: Payer: Self-pay | Admitting: *Deleted

## 2023-06-15 ENCOUNTER — Encounter: Payer: Self-pay | Admitting: Obstetrics & Gynecology

## 2023-06-15 ENCOUNTER — Ambulatory Visit: Payer: BC Managed Care – PPO | Admitting: Obstetrics & Gynecology

## 2023-06-15 VITALS — BP 121/80 | HR 70 | Wt 148.6 lb

## 2023-06-15 DIAGNOSIS — Z975 Presence of (intrauterine) contraceptive device: Secondary | ICD-10-CM

## 2023-06-15 DIAGNOSIS — Z30431 Encounter for routine checking of intrauterine contraceptive device: Secondary | ICD-10-CM

## 2023-06-15 NOTE — Progress Notes (Signed)
      GYNECOLOGY OFFICE ENCOUNTER NOTE  History:  37 y.o. [id number] here today for today for IUD string check; Mirena  IUD was placed  05/18/23.  Having expected mild bleeding, no complaints about the IUD, no concerning side effects.  She does report increased sweating since delivery though, has cesarean section on 04/05/23..   The following portions of the patient's history were reviewed and updated as appropriate: allergies, current medications, past family history, past medical history, past social history, past surgical history and problem list. Last pap smear on 12/28/2021 was normal, negative HRHPV.  Review of Systems:  Pertinent items are noted in HPI.   Objective:  Physical Exam Blood pressure 121/80, pulse 70, weight 148 lb 9.6 oz (67.4 kg), currently breastfeeding. CONSTITUTIONAL: Well-developed, well-nourished female in no acute distress.  NEUROLOGIC: Alert and oriented to person, place, and time. Normal reflexes, muscle tone coordination.  PSYCHIATRIC: Normal mood and affect. Normal behavior. Normal judgment and thought content. CARDIOVASCULAR: Normal heart rate noted RESPIRATORY: Effort and breath sounds normal, no problems with respiration noted ABDOMEN: Soft, no distention noted.   PELVIC: Normal appearing external genitalia; normal appearing vaginal mucosa and cervix.  IUD strings visualized, about 3 cm in length outside cervix. Done in the presence of a chaperone.   Assessment & Plan:  Patient to keep IUD in place for up to eight years; can come in for removal earlier if she desires or for any concerning side effects.  Sweating is likely secondary to postpartum hormonal changes. Offered TSH check, she declined. Not likely to be due to Mirena. Will continue to monitor.   Anne Collins, MD, FACOG Obstetrician & Gynecologist, [facility] for [employer], [facility]

## 2023-07-02 IMAGING — US US OB < 14 WEEKS - US OB TV
1 series · 13 of 28 positions shown · non-contrast
Comparison: None.

CLINICAL DATA: Pregnancy of unknown anatomic location. First
trimester pregnancy for dating.

EXAM:
OBSTETRIC <14 WK US AND TRANSVAGINAL OB US
TECHNIQUE: Both transabdominal and transvaginal ultrasound examinations were
performed for complete evaluation of the gestation as well as the
maternal uterus, adnexal regions, and pelvic cul-de-sac.
Transvaginal technique was performed to assess early pregnancy.

[Series 1: us ob less than 14 weeks with ob transvaginal · 13 of 134 slices shown]
[im 5/134]
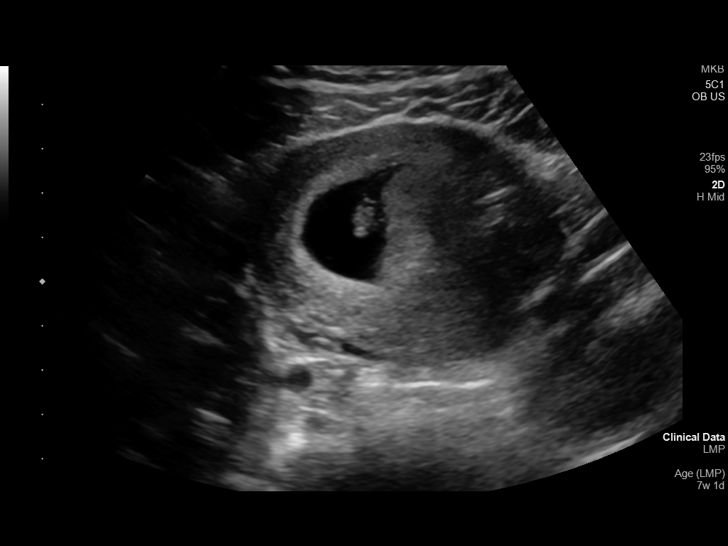
[im 15/134]
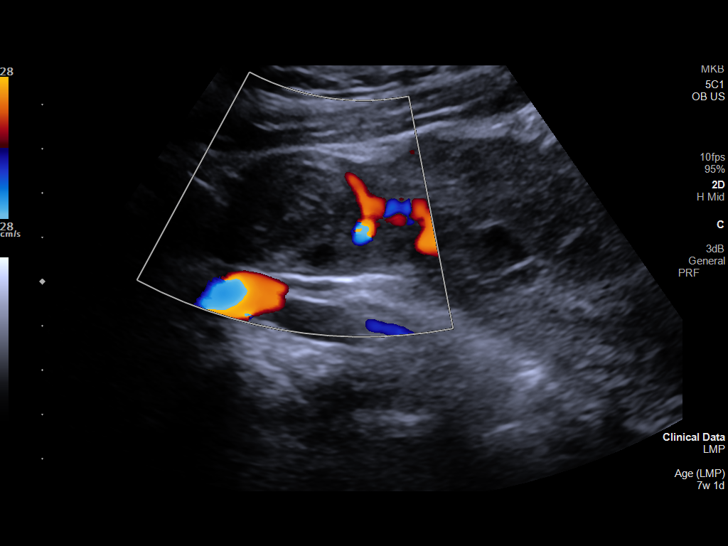
[im 25/134]
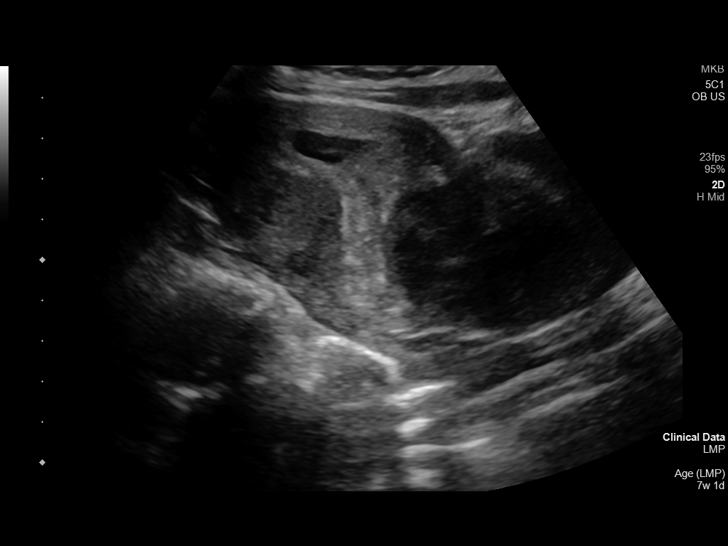
[im 35/134]
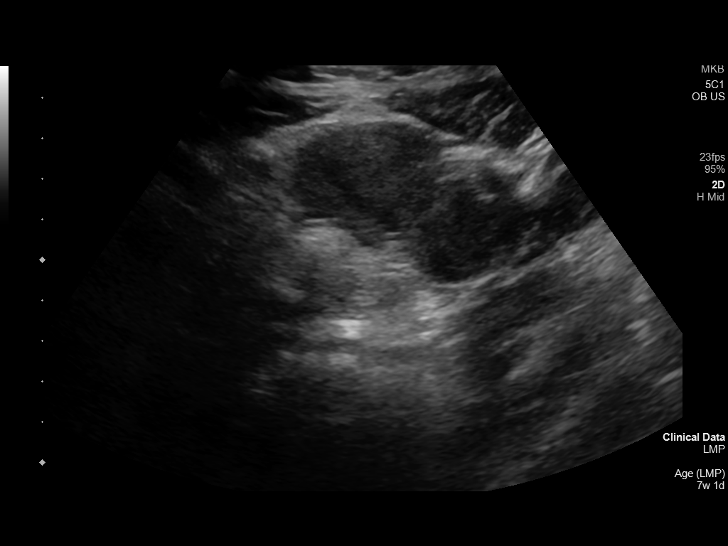
[im 45/134]
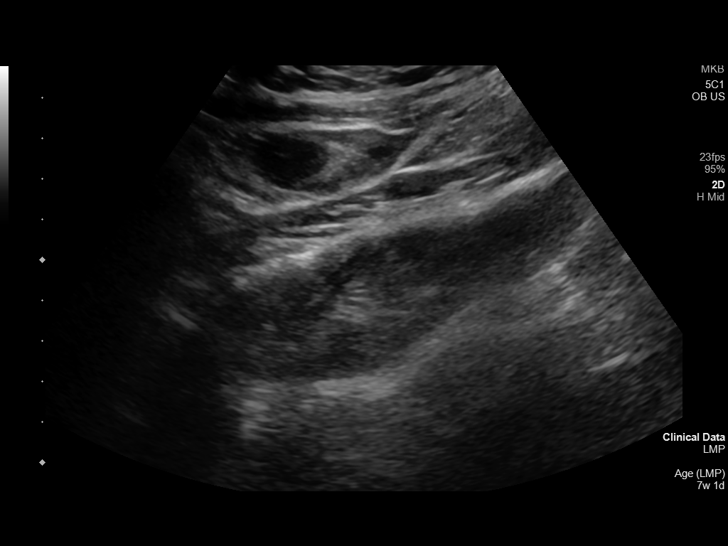
[im 55/134]
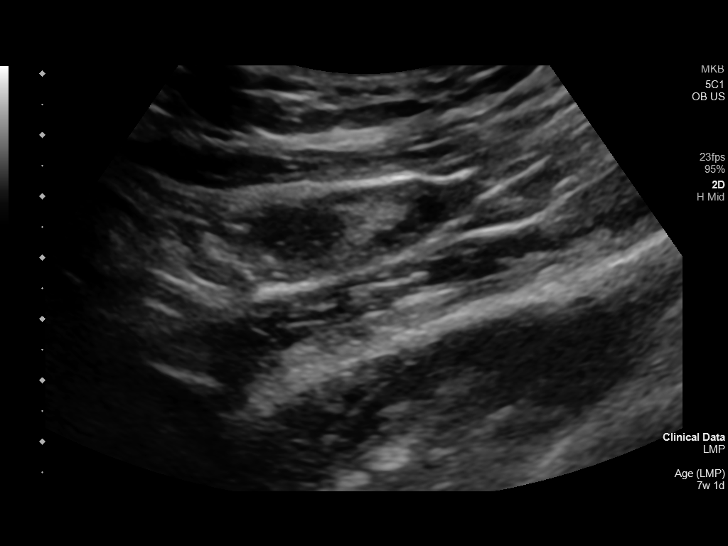
[im 69/134]
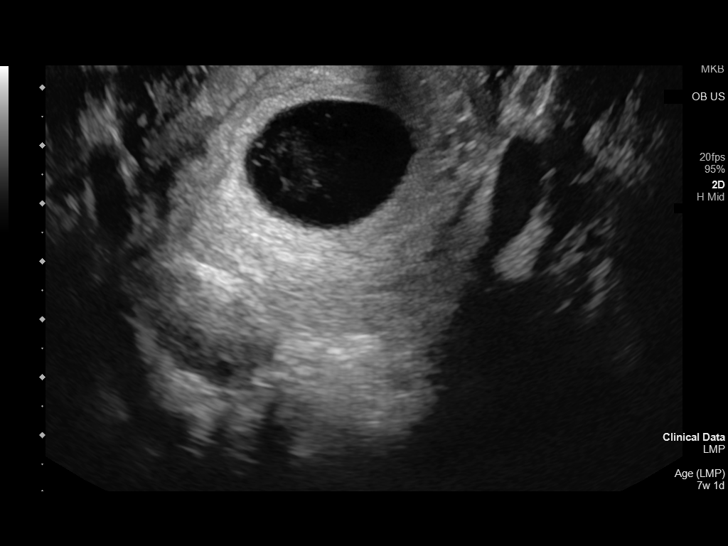
[im 79/134]
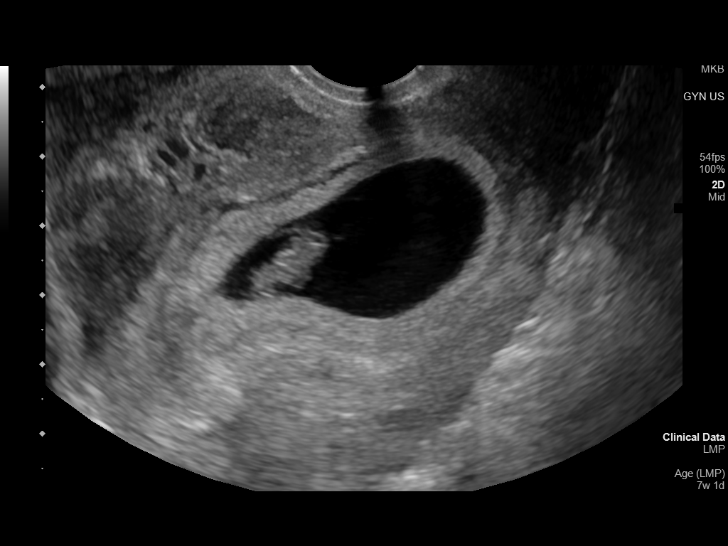
[im 89/134]
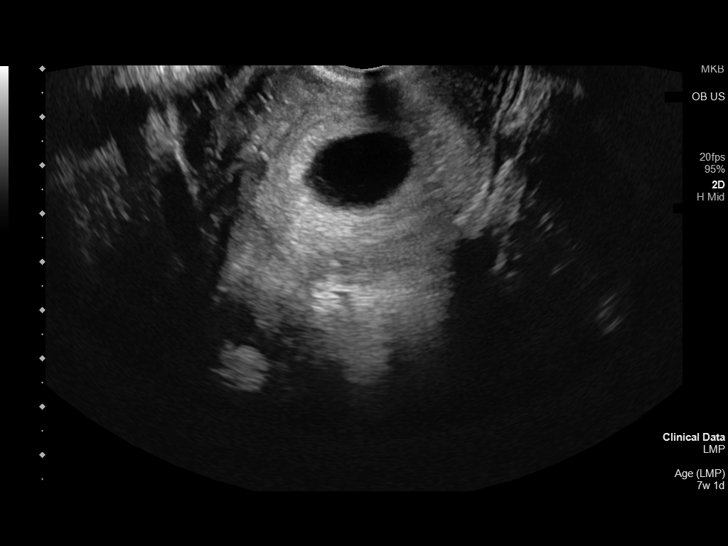
[im 99/134]
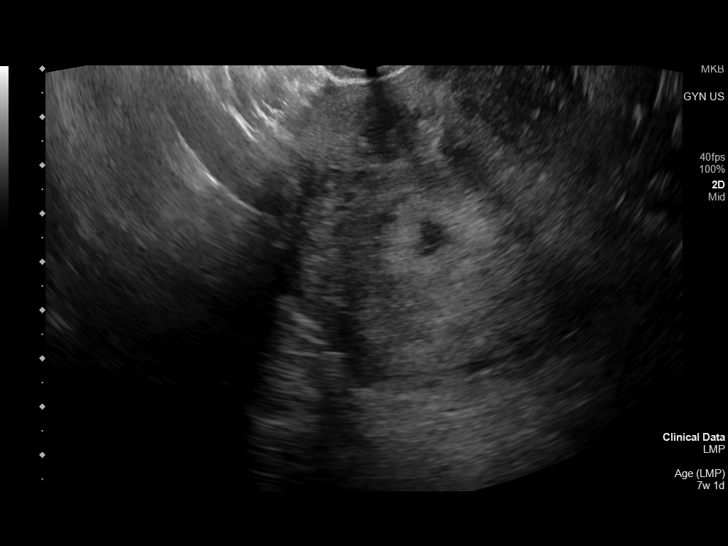
[im 109/134]
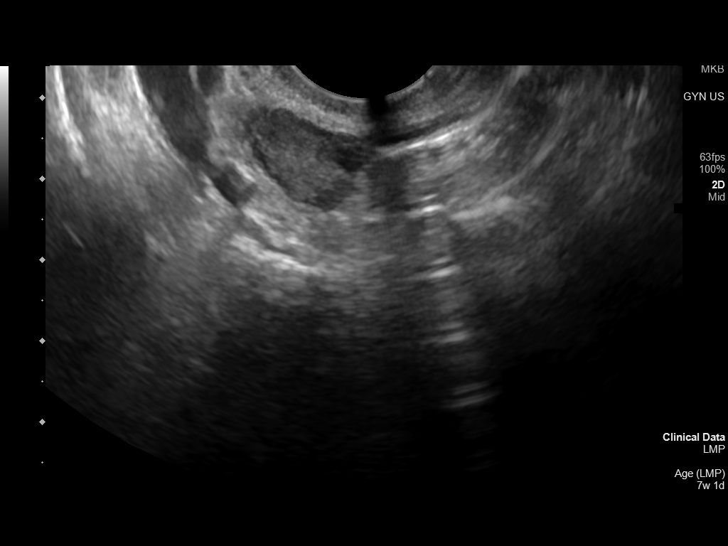
[im 119/134]
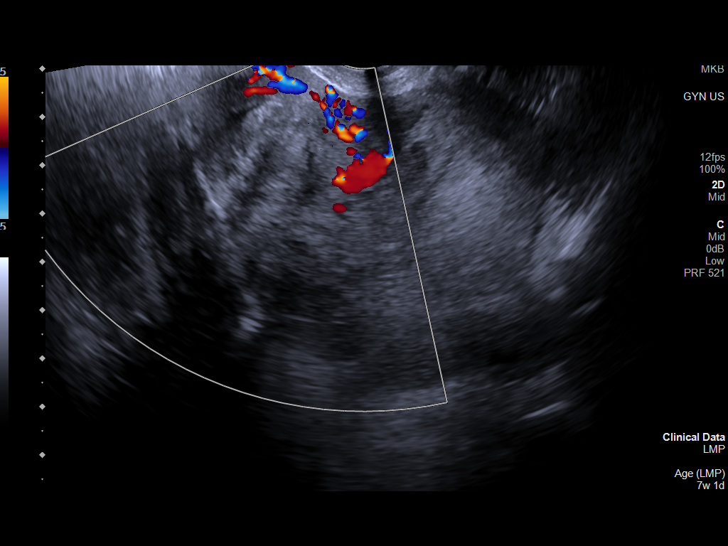
[im 129/134]
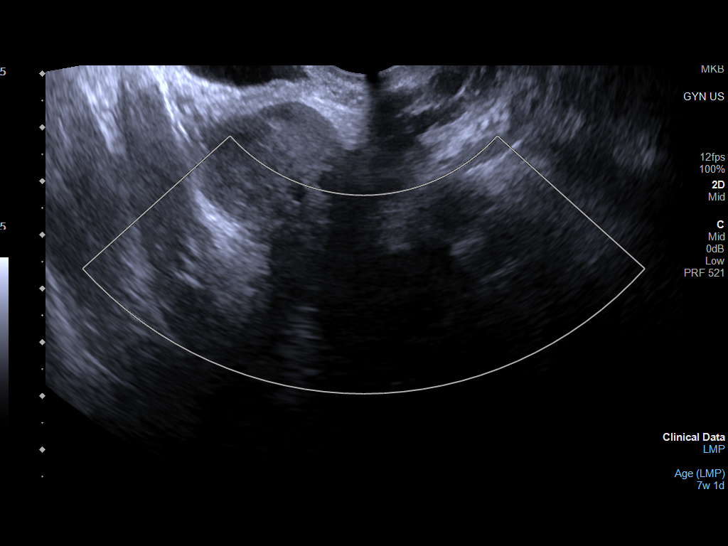

[13 of 28 positions shown; findings below may reference images not displayed]

FINDINGS: Intrauterine gestational sac: Single

Yolk sac:  Visualized.

Embryo:  Visualized.

Cardiac Activity: Visualized.

Heart Rate: 153 bpm

CRL:  12.8 mm   7 w   4 d                  US EDC: 11/18/2021

Subchorionic hemorrhage:  None visualized.

Maternal uterus/adnexae: Single live intrauterine gestation. There
are multiple uterine fibroids. Largest fibroids include right-sided
subserosal fibroid measuring 3.7 x 3.8 x 3.7 cm (when measured
transvaginally), mid body intramural fibroid measuring 2.5 x 2.1 x
2.7 cm, and exophytic fibroid from the left fundus measuring 2.9 x
2.9 x 3.8 cm. Both ovaries are visualized, there is a 1.8 x 1.4 x
2.1 cm corpus luteal cyst in the left ovary. Ovarian blood flow is
seen. No extra ovarian adnexal mass. No pelvic free fluid.
IMPRESSION: 1. Single live intrauterine pregnancy estimated gestational age 7
weeks 4 days based on crown-rump length for ultrasound EDC
11/18/2021. No subchorionic hemorrhage.
2. Multiple uterine fibroids.

## 2023-07-26 ENCOUNTER — Encounter: Payer: Self-pay | Admitting: *Deleted

## 2023-10-30 IMAGING — US US MFM OB FOLLOW-UP
1 series · 14 of 28 positions shown · non-contrast
Comparison: none

[Series 1: us mfm ob follow-up · 59 acquisitions, 14 frames shown]
[im 3/59]
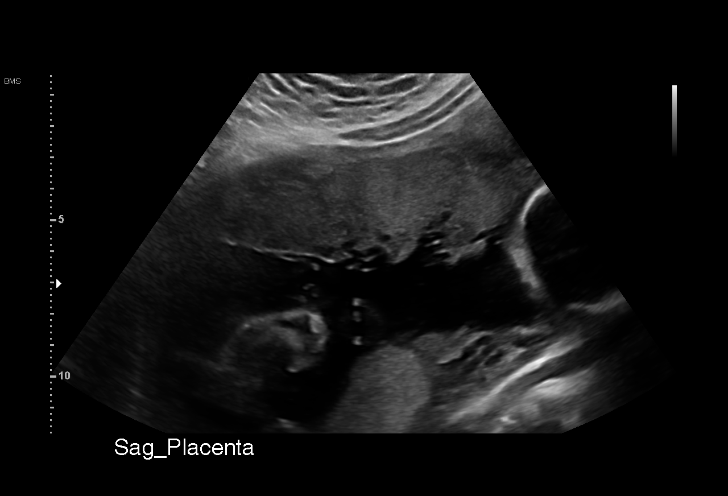
[im 7/59]
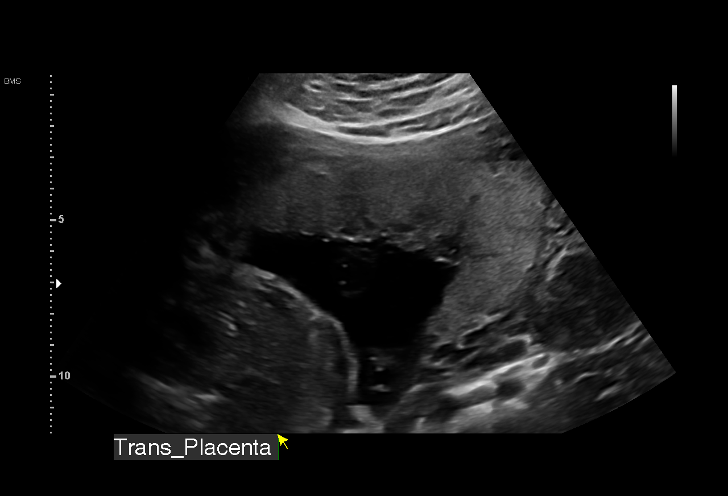
[im 11/59]
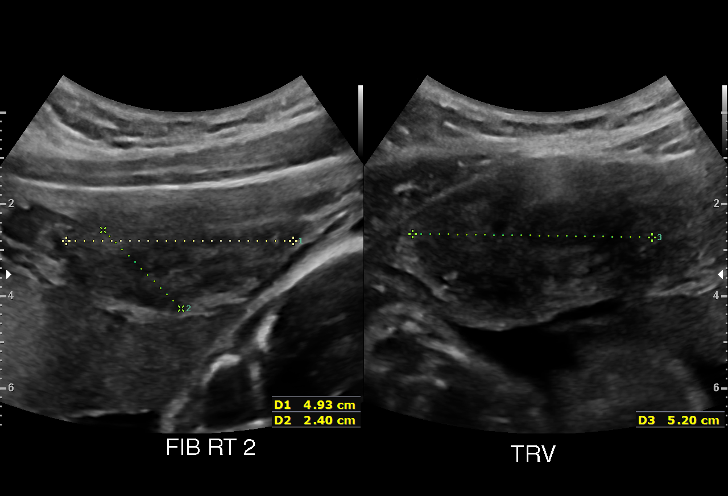
[im 16/59]
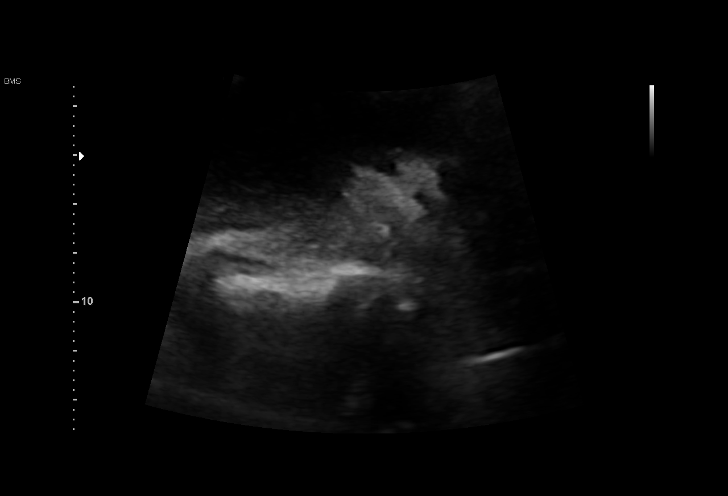
[im 20/59]
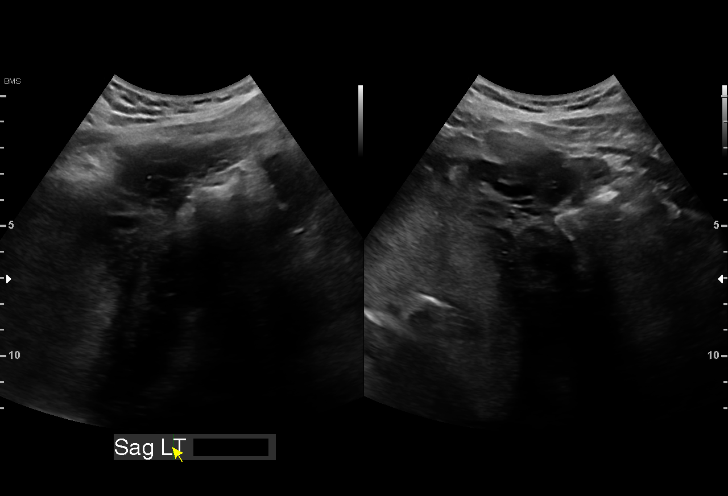
[im 24/59]
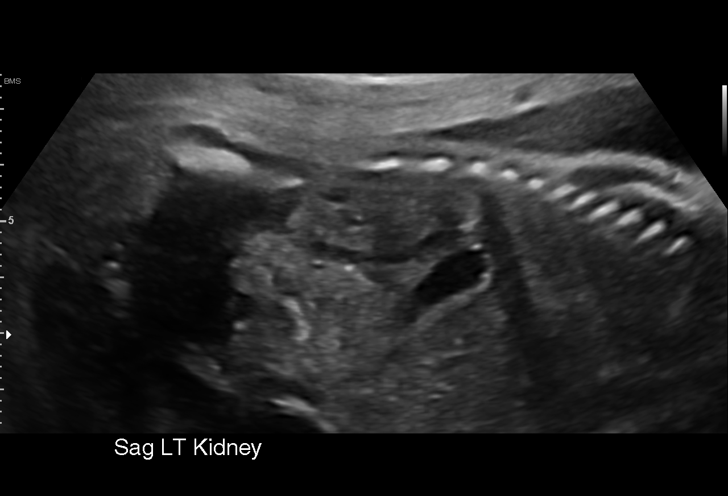
[im 28/59]
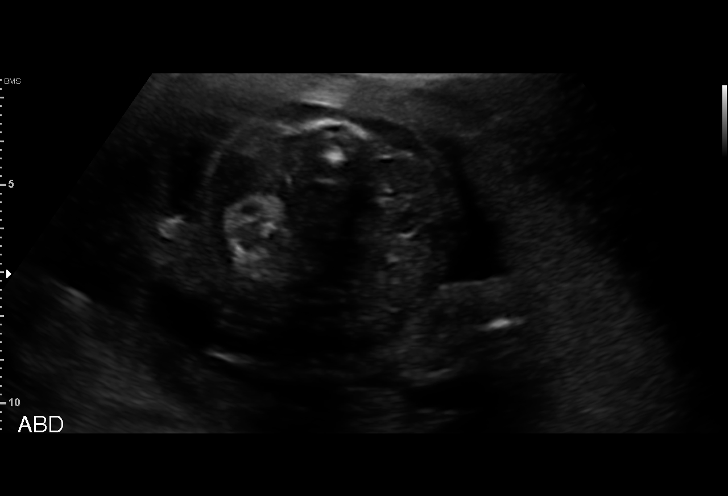
[im 33/59]
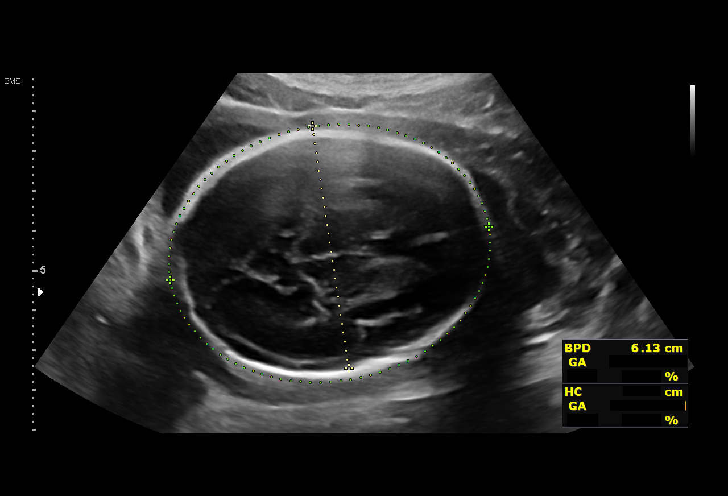
[im 37/59]
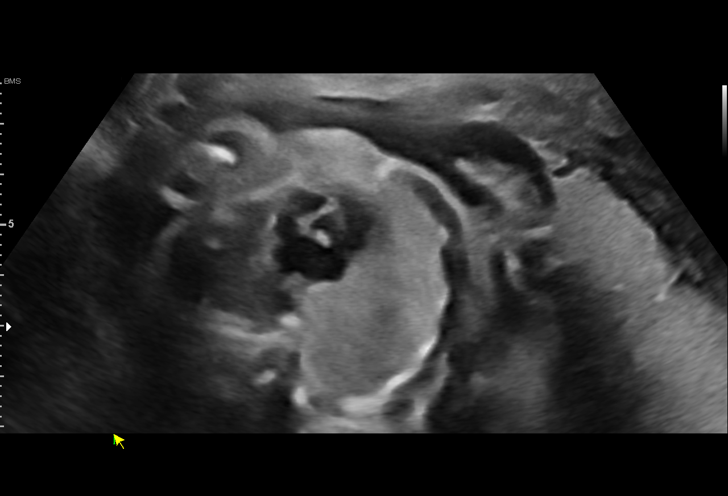
[im 41/59]
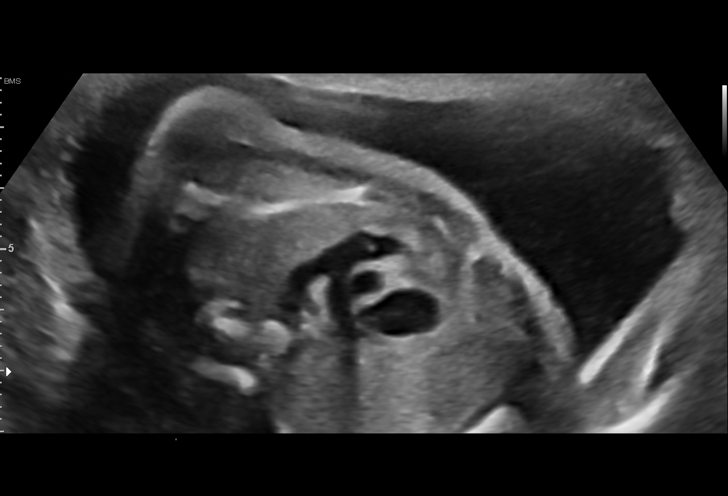
[im 46/59]
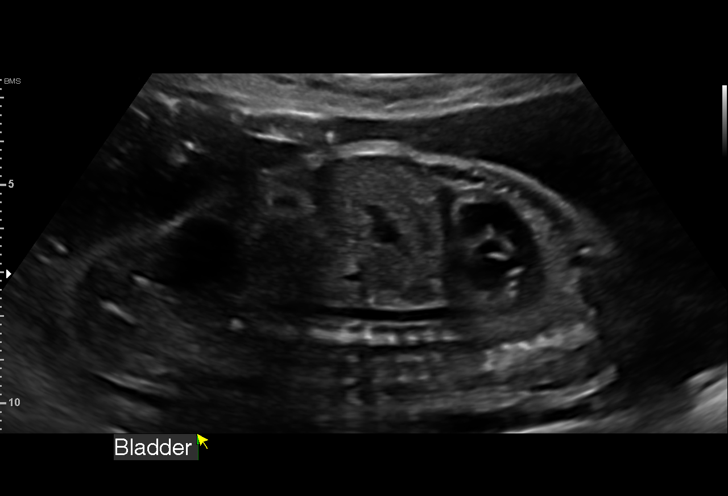
[im 50/59]
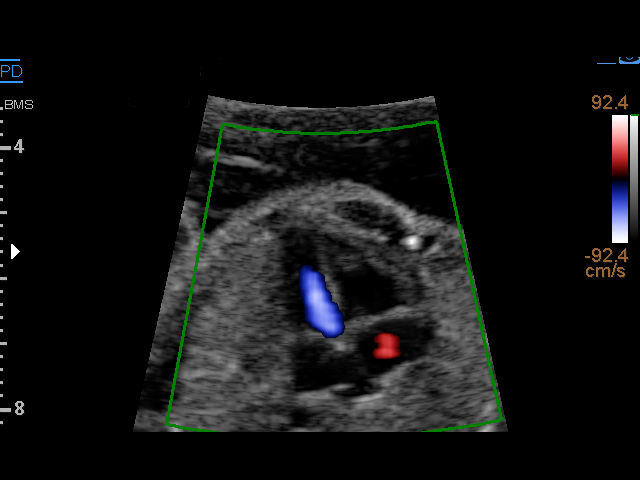
[im 54/59]
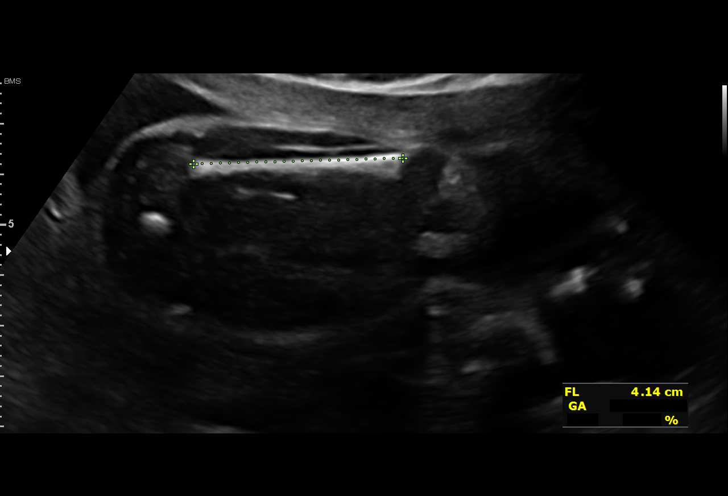
[im 59/59]
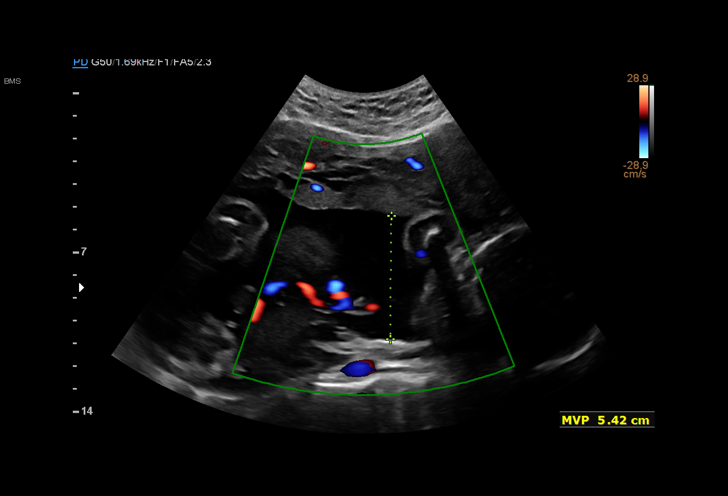

[14 of 28 positions shown; findings below may reference images not displayed]

FARRUKH

Indications

 Advanced maternal age multigravida 35+,
 second trimester
 Alpha thalassemia silent carrier
 Uterine fibroids affecting pregnancy in         O34.12,
 second trimester, antepartum
 Low Risk NIPS
 24 weeks gestation of pregnancy
Fetal Evaluation

 Num Of Fetuses:          1
 Fetal Heart Rate(bpm):   150
 Cardiac Activity:        Observed
 Presentation:            Cephalic
 Placenta:                Anterior
 P. Cord Insertion:       Visualized, central

 Amniotic Fluid
 AFI FV:      Within normal limits

                             Largest Pocket(cm)

Biometry

 BPD:      60.6  mm     G. Age:  24w 5d         57  %    CI:        70.65   %    70 - 86
                                                         FL/HC:       18.1  %    18.7 -
 HC:      229.8  mm     G. Age:  25w 0d         59  %    HC/AC:       1.16       1.05 -
 AC:      198.3  mm     G. Age:  24w 3d         48  %    FL/BPD:      68.6  %    71 - 87
 FL:       41.6  mm     G. Age:  23w 4d         16  %    FL/AC:       21.0  %    20 - 24
 HUM:      37.4  mm     G. Age:  23w 1d         14  %
 CER:      27.8  mm     G. Age:  24w 5d         71  %

 LV:        3.6  mm
 CM:        7.1  mm

 Est. FW:     670   gm     1 lb 8 oz     36  %
OB History

 Gravidity:    2          SAB:   1
 Living:       0
Gestational Age

 LMP:           24w 2d        Date:  02/14/21                 EDD:   11/21/21
 U/S Today:     24w 3d                                        EDD:   11/20/21
 Best:          24w 2d     Det. By:  LMP  (02/14/21)          EDD:   11/21/21
Anatomy

 Cranium:               Appears normal         LVOT:                   Appears normal
 Cavum:                 Appears normal         Aortic Arch:            Previously seen
 Ventricles:            Appears normal         Ductal Arch:            Appears normal
 Choroid Plexus:        Previously seen        Diaphragm:              Appears normal
 Cerebellum:            Appears normal         Stomach:                Appears normal, left
                                                                       sided
 Posterior Fossa:       Appears normal         Abdomen:                Appears normal
 Nuchal Fold:           Previously seen        Abdominal Wall:         Previously seen
 Face:                  Orbits and profile     Cord Vessels:           Previously seen
                        previously seen
 Lips:                  Appears normal         Kidneys:                Appear normal
 Palate:                Not well visualized    Bladder:                Appears normal
 Thoracic:              Appears normal         Spine:                  Previously seen
 Heart:                 Appears normal         Upper Extremities:      Previously seen
                        (4CH, axis, and
                        situs)
 RVOT:                  Appears normal         Lower Extremities:      Previously seen

 Other:  Fetus appears to be female.
Cervix Uterus Adnexa

 Cervix
 Not visualized (advanced GA >15wks)

 Uterus
 Multiple fibroids noted, see table below.

 Right Ovary
 Not visualized.

 Left Ovary
 Not visualized.

 Cul De Sac
 No free fluid seen.
 Adnexa
 No adnexal mass visualized.
Myomas

 Site                     L(cm)      W(cm)       D(cm)      Location
 Anterior
 Anterior Right
 Posterior Left

 Blood Flow                  RI       PI       Comments

Impression

 Follow up growth to complete the fetal anatomy
 Normal interval growth with measurements consistent with
 dates
 Good fetal movement and amniotic fluid volume
Recommendations

 Repeat limited exam in 2-3 weeks to assess fetal bowel

## 2024-02-22 IMAGING — US US EXTREM LOW VENOUS
1 series · 14 of 24 positions shown · non-contrast
Comparison: None.

CLINICAL DATA: Bilateral lower extremity swelling.

EXAM:
Bilateral LOWER EXTREMITY VENOUS DOPPLER ULTRASOUND
TECHNIQUE: Gray-scale sonography with compression, as well as color and duplex
ultrasound, were performed to evaluate the deep venous system(s)
from the level of the common femoral vein through the popliteal and
proximal calf veins.

[Series 1: us venous img lower bilat (dvt) · portal-venous · 14 of 58 slices shown]
[im 1/58]
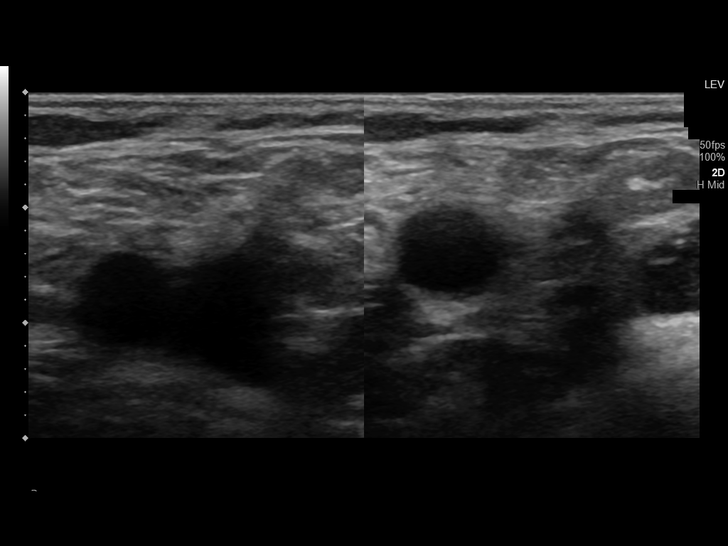
[im 5/58]
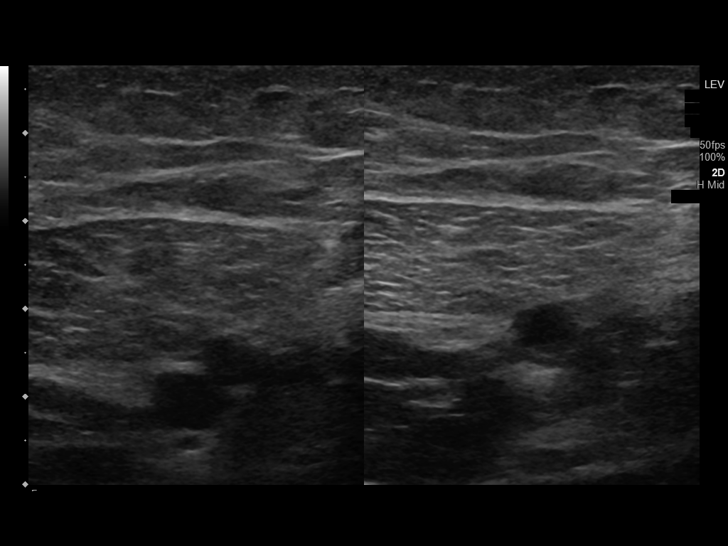
[im 10/58]
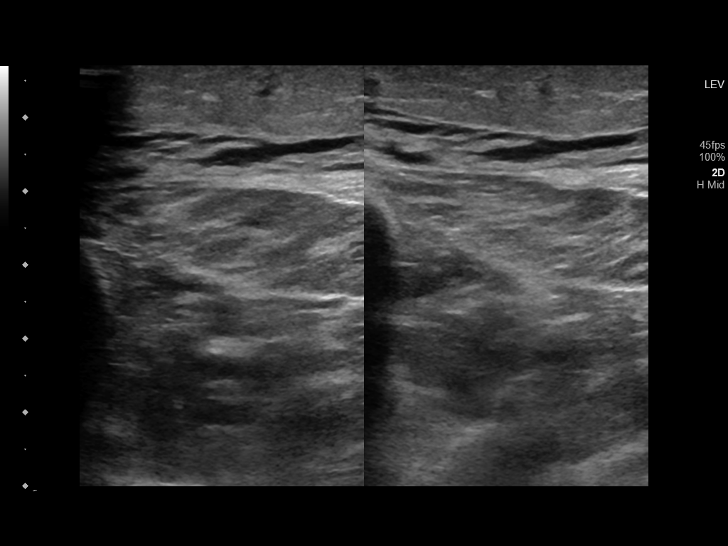
[im 15/58]
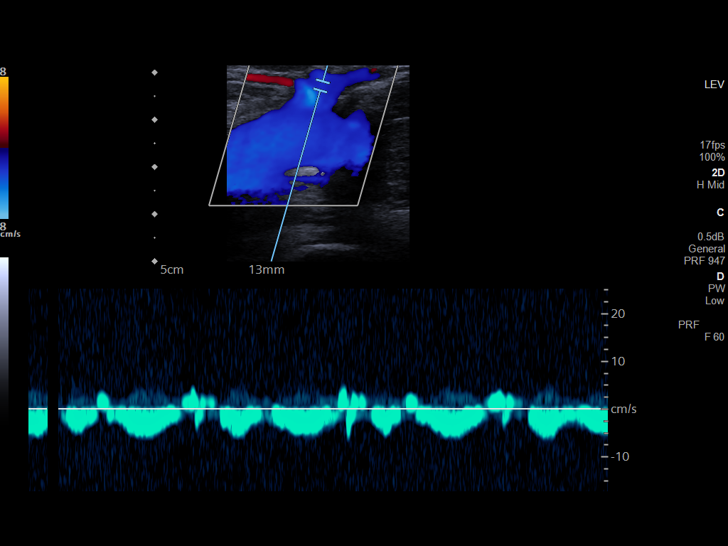
[im 18/58]
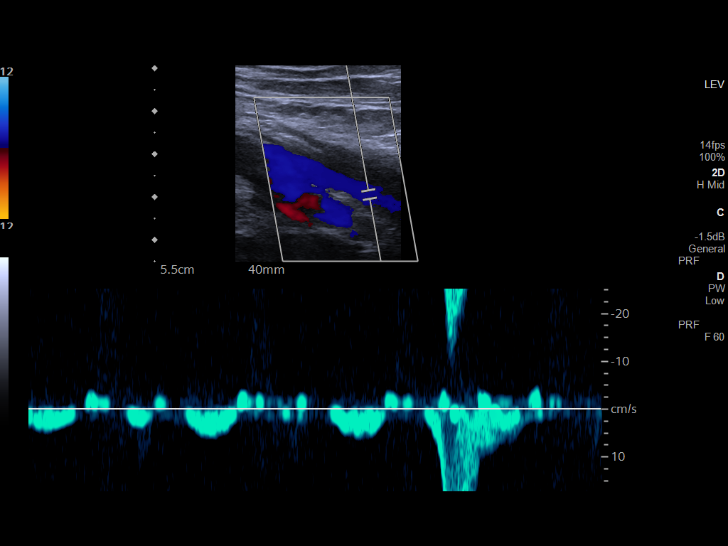
[im 23/58]
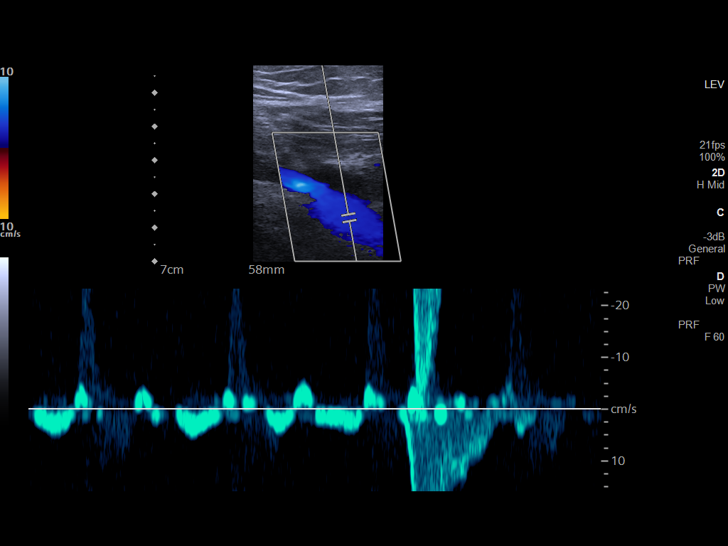
[im 28/58]
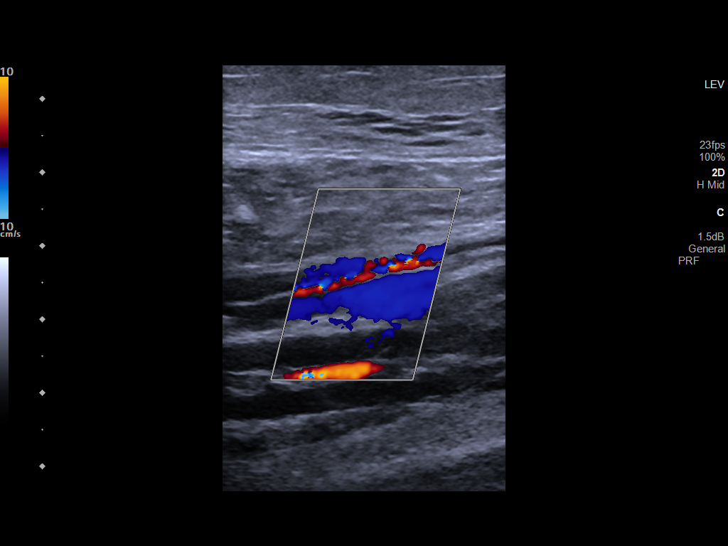
[im 30/58]
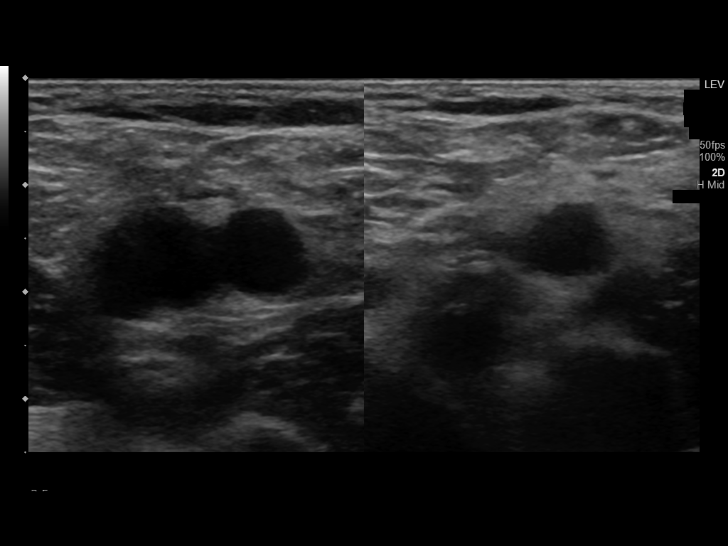
[im 35/58]
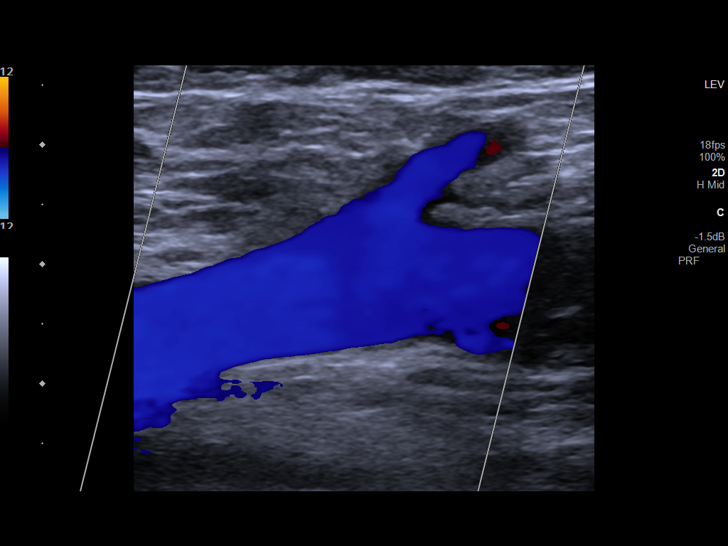
[im 40/58]
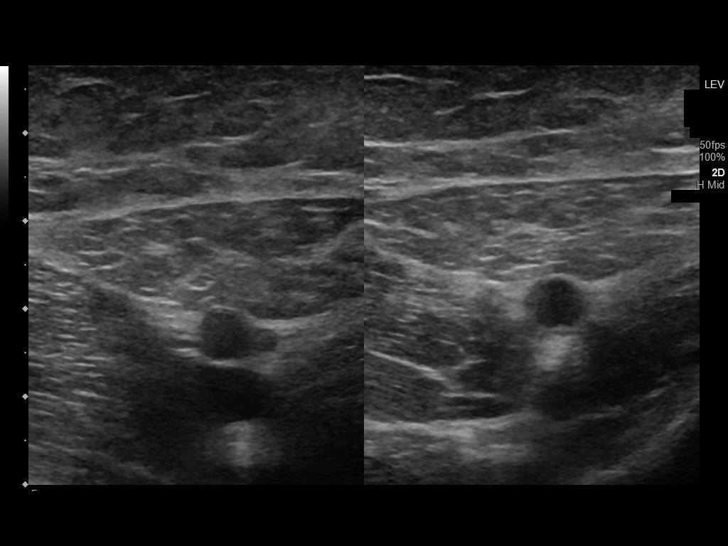
[im 45/58]
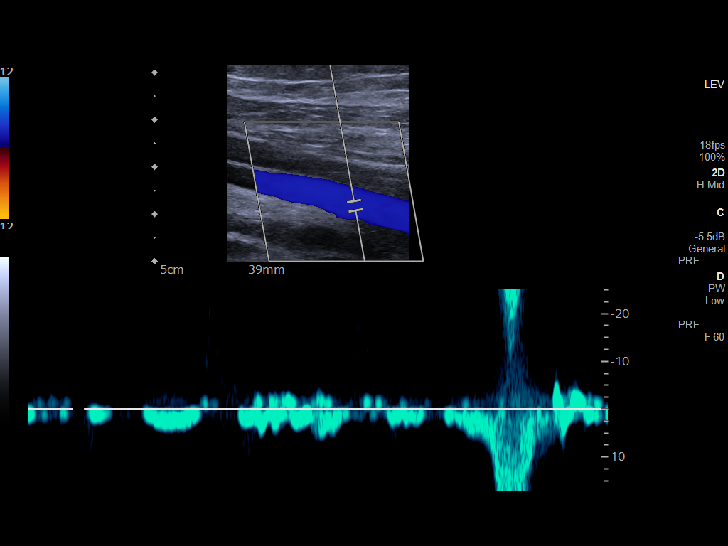
[im 48/58]
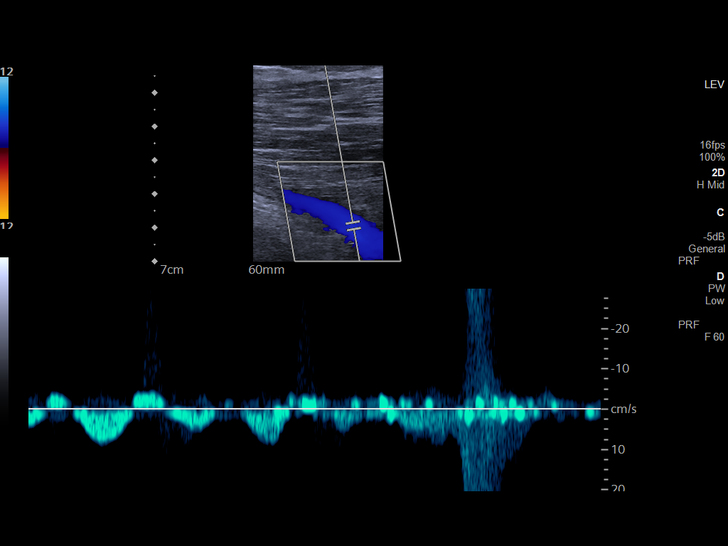
[im 53/58]
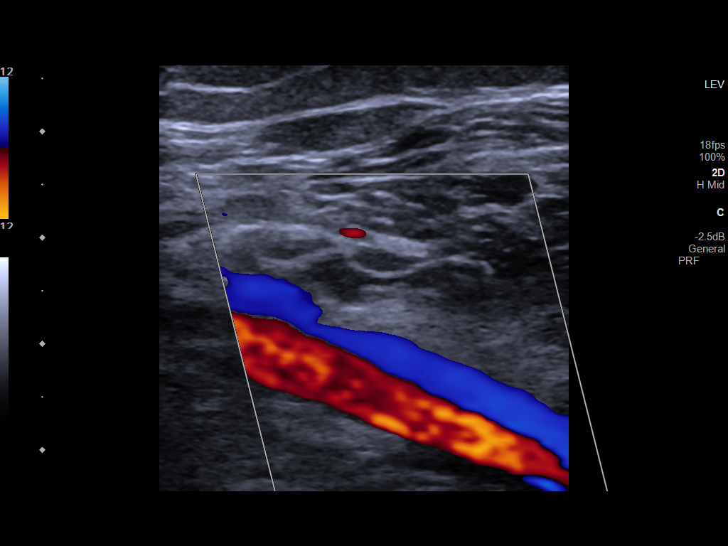
[im 58/58]
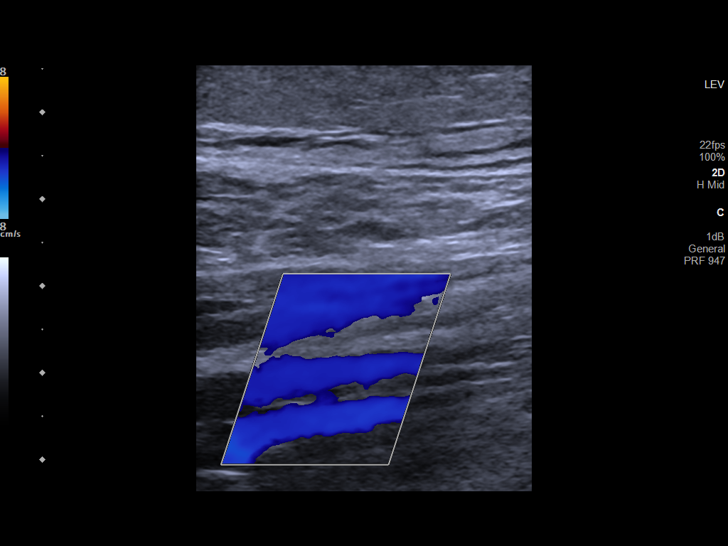

[14 of 24 positions shown; findings below may reference images not displayed]

FINDINGS: VENOUS

Normal compressibility of the common femoral, superficial femoral,
and popliteal veins, as well as the visualized calf veins.
Visualized portions of profunda femoral vein and great saphenous
vein unremarkable. No filling defects to suggest DVT on grayscale or
color Doppler imaging. Doppler waveforms show normal direction of
venous flow, normal respiratory plasticity and response to
augmentation.

Limited views of the contralateral common femoral vein are
unremarkable.

OTHER

None.

Limitations: none
IMPRESSION: Negative.

## 2024-08-14 ENCOUNTER — Telehealth: Payer: Self-pay

## 2024-08-14 NOTE — Telephone Encounter (Signed)
 Noted. Thanks.

## 2024-08-14 NOTE — Telephone Encounter (Signed)
 SABRA

## 2024-08-14 NOTE — Telephone Encounter (Signed)
 Sending note to Dr Cleatus who is out of office and Dr KANDICE who is in office and [place] pool.

## 2024-08-22 ENCOUNTER — Ambulatory Visit
Admission: EM | Admit: 2024-08-22 | Discharge: 2024-08-22 | Disposition: A | Discharge status: Home / Self Care | Attending: Emergency Medicine | Admitting: Emergency Medicine

## 2024-08-22 ENCOUNTER — Ambulatory Visit: Payer: Self-pay

## 2024-08-22 ENCOUNTER — Encounter: Payer: Self-pay | Admitting: Emergency Medicine

## 2024-08-22 DIAGNOSIS — J069 Acute upper respiratory infection, unspecified: Secondary | ICD-10-CM

## 2024-08-22 DIAGNOSIS — H6123 Impacted cerumen, bilateral: Secondary | ICD-10-CM | POA: Diagnosis not present

## 2024-08-22 MED ORDER — AMOXICILLIN-POT CLAVULANATE 875-125 MG PO TABS: 1.0000 | ORAL_TABLET | Freq: Two times a day (BID) | ORAL | 0 refills | Status: DC

## 2024-08-22 NOTE — ED Provider Notes (Signed)
 Anne Long    CSN: [id number] Arrival date & time: 08/22/24  1556      History   Chief Complaint Chief Complaint  Patient presents with   Otalgia   Sore Throat    HPI Anne Long is a 38 y.o. female.   Patient presents for evaluation of persisting bilateral ear pain and a sore throat beginning 2 weeks ago.  Associated postnasal drip.  Initially experiencing nasal congestion and a nonproductive cough which have improved.  Completed visit on 08/08/2024 at a different urgent care, diagnosed with viral illness, given Toradol  injection.  No known sick contacts prior.  Tolerable to food and liquids.  Has attempted use of ibuprofen  and Mucinex at home.  Denies fever.  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   Seen on colposcopy after ASCUS +HPV pap smear [ ]  Cryotherapy   Fibroid    History of cryosurgery of cervix 05/03/2021   2020: neg cytology and negative hpv 2018 for cin 2    Patient Active Problem List   Diagnosis Date Noted   Mirena  IUD (intrauterine device) in place since 05/18/23 06/15/2023    Past Surgical History:  Procedure Laterality Date   CESAREAN SECTION N/A 04/05/2023   Procedure: CESAREAN SECTION;  Surgeon: Anne Devaughn Sayres, MD;  Location: [facility];  Service: Obstetrics;  Laterality: N/A;   GYNECOLOGIC CRYOSURGERY      OB History     Gravida  3   Para  2   Term  2   Preterm  0   AB  1   Living  2      SAB  1   IAB  0   Ectopic  0   Multiple  0   Live Births  2            Home Medications    Prior to Admission medications  Medication Sig Start Date End Date Taking? Authorizing Provider  aspirin  81 MG chewable tablet Chew 1 tablet (81 mg total) by mouth daily. Patient not taking: Reported on 05/18/2023 09/28/22   Fredirick Glenys RAMAN, MD  ferrous sulfate  325 (65 FE) MG EC tablet Take  1 tablet (325 mg total) by mouth every other day. Patient not taking: Reported on 05/18/2023 01/19/23   Fredirick Glenys RAMAN, MD  ibuprofen  (ADVIL ) 600 MG tablet Take 1 tablet (600 mg total) by mouth every 6 (six) hours. Patient not taking: Reported on 06/15/2023 04/08/23   Fredirick Glenys RAMAN, MD  omeprazole  (PRILOSEC ) 20 MG capsule TAKE 1 CAPSULE BY MOUTH EVERY DAY 05/29/23   Fredirick Glenys RAMAN, MD  Prenatal Vit-Fe Fumarate-FA (PRENATAL VITAMINS PO) Take 1 tablet by mouth daily.    [provider]    Family History Family History  Problem Relation Age of Onset   Hypertension Mother    Heart disease Father    Heart disease Paternal Uncle    Ovarian cancer Maternal Grandmother 9   Diabetes Paternal Grandmother    Asthma Neg Hx    Cancer Neg Hx     Social History Social History[1]   Allergies   Other   Review of Systems Review of Systems  Constitutional: Negative.   HENT:  Positive for congestion, ear pain and sore throat. Negative for dental problem, drooling, ear discharge, facial swelling, hearing loss, mouth sores, nosebleeds, postnasal drip,  rhinorrhea, sinus pressure, sinus pain, sneezing, tinnitus, trouble swallowing and voice change.   Respiratory:  Positive for cough. Negative for apnea, choking, chest tightness, shortness of breath, wheezing and stridor.      Physical Exam Triage Vital Signs ED Triage Vitals  Encounter Vitals Group     BP 08/22/24 1720 131/75     Girls Systolic BP Percentile --      Girls Diastolic BP Percentile --      Boys Systolic BP Percentile --      Boys Diastolic BP Percentile --      Pulse Rate 08/22/24 1720 85     Resp 08/22/24 1720 18     Temp 08/22/24 1720 98.2 F (36.8 C)     Temp Source 08/22/24 1720 Oral     SpO2 08/22/24 1720 99 %     Weight --      Height --      Head Circumference --      Peak Flow --      Pain Score 08/22/24 1724 4     Pain Loc --      Pain Education --      Exclude from Growth Chart --    No data  found.  Updated Vital Signs BP 131/75 (BP Location: Left Arm)   Pulse 85   Temp 98.2 F (36.8 C) (Oral)   Resp 18   LMP 08/10/2024 (Approximate)   SpO2 99%   Breastfeeding No   Visual Acuity Right Eye Distance:   Left Eye Distance:   Bilateral Distance:    Right Eye Near:   Left Eye Near:    Bilateral Near:     Physical Exam Constitutional:      Appearance: Normal appearance.  HENT:     Head: Normocephalic.     Right Ear: There is impacted cerumen.     Left Ear: There is impacted cerumen.     Nose: Congestion present. No rhinorrhea.     Mouth/Throat:     Pharynx: No oropharyngeal exudate or posterior oropharyngeal erythema.  Eyes:     Extraocular Movements: Extraocular movements intact.  Cardiovascular:     Rate and Rhythm: Normal rate and regular rhythm.     Pulses: Normal pulses.     Heart sounds: Normal heart sounds.  Pulmonary:     Effort: Pulmonary effort is normal.     Breath sounds: Normal breath sounds.  Neurological:     Mental Status: She is alert and oriented to person, place, and time.      UC Treatments / Results  Labs (all labs ordered are listed, but only abnormal results are displayed) Labs Reviewed - No data to display  EKG   Radiology No results found.  Procedures Procedures (including critical care time)  Medications Ordered in UC Medications - No data to display  Initial Impression / Assessment and Plan / UC Course  I have reviewed the triage vital signs and the nursing notes.  Pertinent labs & imaging results that were available during my care of the patient were reviewed by me and considered in my medical decision making (see chart for details).  Acute URI, cerumen impaction bilateral  Vital signs stable, patient in no signs of distress nontoxic-appearing, impaction of cerumen noted on exam, water  irrigation completed by nursing staff, successful reevaluation no signs of infection, rapid strep test completed at visit on 1127,  negative, will not repeat today, symptoms persisting for 2 weeks without resolve prescribed Augmentin for treatment, recommended  additional over-the-counter medications and nonpharmacological supportive care with follow-up as needed Final Clinical Impressions(s) / UC Diagnoses   Final diagnoses:  None   Discharge Instructions   None    ED Prescriptions   None    PDMP not reviewed this encounter.     [1]  Social History Tobacco Use   Smoking status: Never   Smokeless tobacco: Never  Vaping Use   Vaping status: Never Used  Substance Use Topics   Alcohol use: No   Drug use: No     Teresa Shelba SAUNDERS, NP 08/22/24 1909

## 2024-08-22 NOTE — Telephone Encounter (Signed)
 Can she get scheduled sooner?

## 2024-08-22 NOTE — Discharge Instructions (Signed)
 Symptoms have persisted for 2 weeks and at this time we will provide bacterial coverage as this is most likely the reason symptoms are lingering  Take Augmentin twice daily for 7 days  On exam ears were impacted by wax, cleaned by nursing staff, able to remove excess wax and on reevaluation there were no signs of infection    You can take Tylenol  and/or Ibuprofen  as needed for fever reduction and pain relief.   For cough: honey 1/2 to 1 teaspoon (you can dilute the honey in water  or another fluid).  You can also use guaifenesin and dextromethorphan for cough. You can use a humidifier for chest congestion and cough.  If you don't have a humidifier, you can sit in the bathroom with the hot shower running.      For sore throat: try warm salt water  gargles, cepacol lozenges, throat spray, warm tea or water  with lemon/honey, popsicles or ice, or OTC cold relief medicine for throat discomfort.   For congestion: take a daily anti-histamine like Zyrtec, Claritin, and a oral decongestant, such as pseudoephedrine.  You can also use Flonase 1-2 sprays in each nostril daily.   It is important to stay hydrated: drink plenty of fluids (water , gatorade/powerade/pedialyte, juices, or teas) to keep your throat moisturized and help further relieve irritation/discomfort.

## 2024-08-22 NOTE — Telephone Encounter (Signed)
 1st attempt at calling patient back---no answer---left a voicemail             Copied from CRM #[id number]. Topic: Clinical - Red Word Triage >> Aug 22, 2024  8:34 AM Viola FALCON wrote: Red Word that prompted transfer to Nurse Triage: Patient having drainage, possible ear infection, pain in left ear when swallowing >> Aug 22, 2024  8:45 AM Viola F wrote: Per Lauraine BROCKS. In red word chat - okay for NT to call patient back

## 2024-08-22 NOTE — Telephone Encounter (Signed)
 FYI Only or Action Required?: FYI only for provider: UC.  Patient was last seen in primary care on 08/17/2022 by Gretta Comer POUR, NP.  Called Nurse Triage reporting Otalgia.  Symptoms began a week ago.  Interventions attempted: OTC medications: ibuprofen .  Symptoms are: unchanged.  Triage Disposition: See Physician Within 24 Hours  Patient/caregiver understands and will follow disposition?: Yes   Reason for Disposition  Earache  (Exceptions: Brief ear pain of lasting less than 60 minutes, or earache occurring during air travel.)  Answer Assessment - Initial Assessment Questions No available appts. Advised UC today and ED if symptoms worsen.  Nurse gave pt [facility] information.  1. LOCATION: Which ear is involved?     Left ear; swallowing makes worse; denies redness, drainage swelling 2. ONSET: When did the ear pain start?      09/10/24 3. SEVERITY: How bad is the pain?  (Scale 1-10; mild, moderate or severe)     4/10 4. URI SYMPTOMS: Do you have a runny nose or cough?    Sore throat, cough, post nasal drip denies diff swallowing , drooling 5. FEVER: Do you have a fever? If Yes, ask: What is your temperature, how was it measured, and when did it start?     Denies fever, chills, n/v 7. OTHER SYMPTOMS: Do you have any other symptoms? (e.g., decreased hearing, dizziness, headache, stiff neck, vomiting)     Denies HA, dizziness, stiff neck, hearing loss, change vision, weakness  Protocols used: Earache-A-AH

## 2024-08-22 NOTE — ED Triage Notes (Signed)
 Patient reports left ear pain  and left sided throat pain when she swallows x 2 weeks. Patient was diagnosed  with URI on 08/08/24 . Patient states throat was hurting on 08/08/24 but still has minor discomfort on left side. Rates ear pain 4/10 and rates throat pain 2/10.

## 2024-08-30 ENCOUNTER — Encounter: Payer: Self-pay | Admitting: Family Medicine

## 2024-08-30 ENCOUNTER — Ambulatory Visit: Admitting: Family Medicine

## 2024-08-30 VITALS — BP 100/70 | HR 95 | Temp 98.7°F | Ht 59.0 in | Wt 146.2 lb

## 2024-08-30 DIAGNOSIS — R0981 Nasal congestion: Secondary | ICD-10-CM

## 2024-08-30 DIAGNOSIS — Z0000 Encounter for general adult medical examination without abnormal findings: Secondary | ICD-10-CM

## 2024-08-30 DIAGNOSIS — D649 Anemia, unspecified: Secondary | ICD-10-CM

## 2024-08-30 DIAGNOSIS — Z7189 Other specified counseling: Secondary | ICD-10-CM

## 2024-08-30 DIAGNOSIS — Z975 Presence of (intrauterine) contraceptive device: Secondary | ICD-10-CM

## 2024-08-30 DIAGNOSIS — R5383 Other fatigue: Secondary | ICD-10-CM

## 2024-08-30 NOTE — Patient Instructions (Signed)
 Go to the lab on the way out.   If you have mychart we'll likely use that to update you.    Take care.  Glad to see you.

## 2024-08-30 NOTE — Progress Notes (Unsigned)
 CPE- See plan.  Routine anticipatory guidance given to patient.  See health maintenance.  The possibility exists that previously documented standard health maintenance information may have been brought forward from a previous encounter into this note.  If needed, that same information has been updated to reflect the current situation based on today's encounter.    Tdap 2022 Flu 2025.  Shingles and PNA not due.  Colon and pap not due.   Living will d/w pt.  Husband designated if patient were incapacitated.   Variable menses with IUD in place.    D/w pt about possible ENT eval.  She has recurrent post nasal gtt.  Worse in the wintertime.  She has recurrent sick exposures.  She recovered from last illness.    Fatigue. Easy to fall asleep.   She wakes easily, up at [facility].  D/w pt about eval.  No SI/HI.  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  GERD was worse during pregnancy, clearly better now.  PMH and SH reviewed  Meds, vitals, and allergies reviewed.   ROS: Per HPI.  Unless specifically indicated otherwise in HPI, the patient denies:  General: fever. Eyes: acute vision changes ENT: sore throat Cardiovascular: chest pain Respiratory: SOB GI: vomiting GU: dysuria Musculoskeletal: acute back pain Derm: acute rash Neuro: acute motor dysfunction Psych: worsening mood Endocrine: polydipsia Heme: bleeding Allergy: hayfever  GEN: nad, alert and oriented HEENT: mucous membranes moist, TM wnl B, R>L nasal congestion.  NECK: supple w/o LA CV: rrr. PULM: ctab, no inc wob ABD: soft, +bs EXT: no edema SKIN: no acute rash

## 2024-08-31 LAB — CBC WITH DIFFERENTIAL/PLATELET
Absolute Lymphocytes: 2864 {cells}/uL (ref 850–3900)
Absolute Monocytes: 626 {cells}/uL (ref 200–950)
Basophils Absolute: 31 {cells}/uL (ref 0–200)
Basophils Relative: 0.5 %
Eosinophils Absolute: 99 {cells}/uL (ref 15–500)
Eosinophils Relative: 1.6 %
HCT: 37.7 % (ref 35.9–46.0)
Hemoglobin: 12.1 g/dL (ref 11.7–15.5)
MCH: 26.7 pg — ABNORMAL LOW (ref 27.0–33.0)
MCHC: 32.1 g/dL (ref 31.6–35.4)
MCV: 83 fL (ref 81.4–101.7)
MPV: 11.6 fL (ref 7.5–12.5)
Monocytes Relative: 10.1 %
Neutro Abs: 2579 {cells}/uL (ref 1500–7800)
Neutrophils Relative %: 41.6 %
Platelets: 342 Thousand/uL (ref 140–400)
RBC: 4.54 Million/uL (ref 3.80–5.10)
RDW: 12.8 % (ref 11.0–15.0)
Total Lymphocyte: 46.2 %
WBC: 6.2 Thousand/uL (ref 3.8–10.8)

## 2024-08-31 LAB — COMPREHENSIVE METABOLIC PANEL WITH GFR
AG Ratio: 1.4 (calc) (ref 1.0–2.5)
ALT: 10 U/L (ref 6–29)
AST: 12 U/L (ref 10–30)
Albumin: 4.2 g/dL (ref 3.6–5.1)
Alkaline phosphatase (APISO): 60 U/L (ref 31–125)
BUN: 11 mg/dL (ref 7–25)
CO2: 28 mmol/L (ref 20–32)
Calcium: 9.4 mg/dL (ref 8.6–10.2)
Chloride: 103 mmol/L (ref 98–110)
Creat: 0.68 mg/dL (ref 0.50–0.97)
Globulin: 2.9 g/dL (ref 1.9–3.7)
Glucose, Bld: 81 mg/dL (ref 65–99)
Potassium: 4.1 mmol/L (ref 3.5–5.3)
Sodium: 138 mmol/L (ref 135–146)
Total Bilirubin: 0.3 mg/dL (ref 0.2–1.2)
Total Protein: 7.1 g/dL (ref 6.1–8.1)
eGFR: 115 mL/min/1.73m2

## 2024-08-31 LAB — VITAMIN D 25 HYDROXY (VIT D DEFICIENCY, FRACTURES): Vit D, 25-Hydroxy: 12 ng/mL — ABNORMAL LOW (ref 30–100)

## 2024-08-31 LAB — TSH: TSH: 1.86 m[IU]/L

## 2024-08-31 LAB — IRON: Iron: 71 ug/dL (ref 40–190)

## 2024-08-31 LAB — FERRITIN: Ferritin: 34 ng/mL (ref 16–154)

## 2024-08-31 LAB — VITAMIN B12: Vitamin B-12: 668 pg/mL (ref 200–1100)

## 2024-09-03 DIAGNOSIS — R0981 Nasal congestion: Secondary | ICD-10-CM | POA: Insufficient documentation

## 2024-09-03 DIAGNOSIS — R5383 Other fatigue: Secondary | ICD-10-CM | POA: Insufficient documentation

## 2024-09-03 DIAGNOSIS — Z7189 Other specified counseling: Secondary | ICD-10-CM | POA: Insufficient documentation

## 2024-09-03 MED ORDER — LORATADINE 10 MG PO TABS: 10.0000 mg | ORAL_TABLET | Freq: Every day | ORAL | Status: AC

## 2024-09-03 MED ORDER — FLUTICASONE PROPIONATE 50 MCG/ACT NA SUSP: 2.0000 | Freq: Every day | NASAL | Status: AC

## 2024-09-03 NOTE — Assessment & Plan Note (Signed)
 She could use claritin  and flonase  and then update me as needed.  We can refer to ENT in the future if needed .

## 2024-09-03 NOTE — Assessment & Plan Note (Signed)
" °  Tdap 2022 Flu 2025.  Shingles and PNA not due.  Colon and pap not due.   Living will d/w pt.  Husband designated if patient were incapacitated.  "

## 2024-09-03 NOTE — Assessment & Plan Note (Signed)
Living will d/w pt.  Husband designated if patient were incapacitated.  

## 2024-09-03 NOTE — Assessment & Plan Note (Signed)
 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  Variable menses.  See notes on labs.  At this point still okay for outpatient follow-up.

## 2024-09-03 NOTE — Assessment & Plan Note (Signed)
 Variable menses with IUD in place.

## 2024-09-04 ENCOUNTER — Ambulatory Visit: Payer: Self-pay | Admitting: Family Medicine

## 2024-09-04 DIAGNOSIS — E559 Vitamin D deficiency, unspecified: Secondary | ICD-10-CM

## 2024-09-04 MED ORDER — VITAMIN D (ERGOCALCIFEROL) 1.25 MG (50000 UNIT) PO CAPS: 50000.0000 [IU] | ORAL_CAPSULE | ORAL | 0 refills | Status: AC
